# Patient Record
Sex: Male | Born: 1975 | Race: White | Hispanic: No | Marital: Married | Smoking: Never smoker
Health system: Southern US, Community
[De-identification: ages and names within clinical notes are randomized; demographics above are authoritative.]

## PROBLEM LIST (undated history)

## (undated) DIAGNOSIS — K5010 Crohn's disease of large intestine without complications: Secondary | ICD-10-CM

## (undated) DIAGNOSIS — I10 Essential (primary) hypertension: Secondary | ICD-10-CM

## (undated) DIAGNOSIS — K625 Hemorrhage of anus and rectum: Secondary | ICD-10-CM

## (undated) HISTORY — DX: Essential (primary) hypertension: I10

## (undated) HISTORY — DX: Crohn's disease of large intestine without complications: K50.10

---

## 2016-12-14 ENCOUNTER — Encounter (INDEPENDENT_AMBULATORY_CARE_PROVIDER_SITE_OTHER): Payer: Self-pay | Admitting: Internal Medicine

## 2016-12-14 ENCOUNTER — Encounter (INDEPENDENT_AMBULATORY_CARE_PROVIDER_SITE_OTHER): Payer: Self-pay

## 2017-01-03 ENCOUNTER — Encounter (INDEPENDENT_AMBULATORY_CARE_PROVIDER_SITE_OTHER): Payer: Self-pay | Admitting: Internal Medicine

## 2017-01-03 ENCOUNTER — Ambulatory Visit (INDEPENDENT_AMBULATORY_CARE_PROVIDER_SITE_OTHER): Payer: Self-pay | Admitting: Internal Medicine

## 2017-01-03 VITALS — BP 170/70 | HR 120 | Temp 97.8°F | Ht 76.0 in | Wt 239.6 lb

## 2017-01-03 DIAGNOSIS — K5010 Crohn's disease of large intestine without complications: Secondary | ICD-10-CM

## 2017-01-03 DIAGNOSIS — I10 Essential (primary) hypertension: Secondary | ICD-10-CM

## 2017-01-03 DIAGNOSIS — K50011 Crohn's disease of small intestine with rectal bleeding: Secondary | ICD-10-CM

## 2017-01-03 HISTORY — DX: Crohn's disease of large intestine without complications: K50.10

## 2017-01-03 HISTORY — DX: Essential (primary) hypertension: I10

## 2017-01-03 MED ORDER — MESALAMINE ER 500 MG PO CPCR: 500.0000 mg | ORAL_CAPSULE | Freq: Three times a day (TID) | ORAL | 3 refills | Status: DC

## 2017-01-03 MED ORDER — AMLODIPINE BESYLATE 5 MG PO TABS: 5.0000 mg | ORAL_TABLET | Freq: Every day | ORAL | 0 refills | Status: DC

## 2017-01-03 NOTE — Progress Notes (Signed)
   Subjective:    Patient ID: Dwayne Cooper, male    DOB: 30-Aug-1975, 41 y.o.   MRN: [id number]  HPI Referred by Dr Anthony Sar for inflammatory bowel disease. Admitted to [facility] hospital n July with profuse diarrhea and rectal bleeding, and abdominal pain. He had been having symptoms for over a year (Rectal bleeding and diarrhea). Also has hemorrhoids.   Per Dr. Anthony Sar notes,  inflammatory changes were consistent with Crohn's colitis.  Underwent a colonoscopy 11/25/2016 for rectal bleeding.  Circumferential diffuse abnormal mucosa was found thru out the entire examined colon. The mucosa had deep ulcers, scarring and granularity.  Biopsy: Colon: Acute colitis. No dysplasia or malignancy identified. Rectum biopsy: chronic active colitis. No dysplasia or malignancy identified.  States when he stops the Prednisone, he has abdominal pain (lower). No rectal bleeding. Stools are formed.  Presently taking 49m Prednisone x 31 days.  States he feels great now as long as he is on the Prednisone.    11/23/2016 CT abdomen/pelvis with CM: abdominal pain with rectal bleeding: Changes consistent with colitis with mild likely reactive lymph nodes. No obstrucdtive changes are seen.    Review of Systems Past Medical History:  Diagnosis Date  . [place] 01/03/[place]  . Essential hypertension, benign 01/03/2017    No past surgical history on file.  No Known Allergies  No current outpatient prescriptions on file prior to visit.   No current facility-administered medications on file prior to visit.         Objective:   Physical Exam  Blood pressure (!) 170/70, pulse (!) 120, temperature 97.8 F (36.6 C), height 6' 4"  (1.93 m), weight 239 lb 9.6 oz (108.7 kg).  Alert and oriented. Skin warm and dry. Oral mucosa is moist.   . Sclera anicteric, conjunctivae is pink. Thyroid not enlarged. No cervical lymphadenopathy. Lungs clear. Heart regular rate and rhythm.  Abdomen is soft. Bowel sounds are positive.  No hepatomegaly. No abdominal masses felt. No tenderness.  No edema to lower extremities.        Assessment & Plan:  Crohn's: Taper Prednisone 3147mand decrease by 47m70meekly. Pentasa 500m18mD.  Hypertenson: Will refill Norvasc 47mg 10mly x 30 days. I will fill this and [facility]

## 2017-01-03 NOTE — Patient Instructions (Signed)
Rx for Pentasa 563m TID Rx for Prednisone 148m #120.

## 2017-01-04 ENCOUNTER — Telehealth (INDEPENDENT_AMBULATORY_CARE_PROVIDER_SITE_OTHER): Payer: Self-pay | Admitting: Internal Medicine

## 2017-01-04 LAB — CBC WITH DIFFERENTIAL/PLATELET
Basophils Absolute: 8 cells/uL (ref 0–200)
Basophils Relative: 0.1 %
EOS ABS: 0 {cells}/uL — AB (ref 15–500)
Eosinophils Relative: 0 %
HCT: 30.7 % — ABNORMAL LOW (ref 38.5–50.0)
Hemoglobin: 9.2 g/dL — ABNORMAL LOW (ref 13.2–17.1)
Lymphs Abs: 720 cells/uL — ABNORMAL LOW (ref 850–3900)
MCH: 21.3 pg — AB (ref 27.0–33.0)
MCHC: 30 g/dL — ABNORMAL LOW (ref 32.0–36.0)
MCV: 71.1 fL — AB (ref 80.0–100.0)
MONOS PCT: 6.1 %
MPV: 9.4 fL (ref 7.5–12.5)
NEUTROS PCT: 84.2 %
Neutro Abs: 6315 cells/uL (ref 1500–7800)
PLATELETS: 409 10*3/uL — AB (ref 140–400)
RBC: 4.32 10*6/uL (ref 4.20–5.80)
RDW: 16.2 % — AB (ref 11.0–15.0)
TOTAL LYMPHOCYTE: 9.6 %
WBC: 7.5 10*3/uL (ref 3.8–10.8)
WBCMIX: 458 {cells}/uL (ref 200–950)

## 2017-01-04 LAB — C-REACTIVE PROTEIN: CRP: 7.1 mg/L (ref <8.0)

## 2017-01-04 NOTE — Telephone Encounter (Signed)
Patient called, stated that he saw Deberah Castle, NP yesterday, he would like to speak to Terri regarding medication she prescribed for him yesterday.  The medication cost is outrageous.  He wants to see what else she suggest.  [phone]

## 2017-01-05 ENCOUNTER — Telehealth (INDEPENDENT_AMBULATORY_CARE_PROVIDER_SITE_OTHER): Payer: Self-pay | Admitting: Internal Medicine

## 2017-01-05 MED ORDER — MESALAMINE ER 0.375 G PO CP24: 375.0000 mg | ORAL_CAPSULE | Freq: Every day | ORAL | 3 refills | Status: DC

## 2017-01-05 NOTE — Telephone Encounter (Signed)
Rx for Apriso sent to his pharmacy

## 2017-01-05 NOTE — Telephone Encounter (Signed)
Samples of Apriso to front. Rx sent to his pharmacy

## 2017-01-08 ENCOUNTER — Other Ambulatory Visit (INDEPENDENT_AMBULATORY_CARE_PROVIDER_SITE_OTHER): Payer: Self-pay | Admitting: *Deleted

## 2017-01-08 DIAGNOSIS — K50011 Crohn's disease of small intestine with rectal bleeding: Secondary | ICD-10-CM

## 2017-01-25 ENCOUNTER — Other Ambulatory Visit (INDEPENDENT_AMBULATORY_CARE_PROVIDER_SITE_OTHER): Payer: Self-pay | Admitting: *Deleted

## 2017-01-25 ENCOUNTER — Encounter (INDEPENDENT_AMBULATORY_CARE_PROVIDER_SITE_OTHER): Payer: Self-pay | Admitting: *Deleted

## 2017-01-25 DIAGNOSIS — K50011 Crohn's disease of small intestine with rectal bleeding: Secondary | ICD-10-CM

## 2017-02-01 ENCOUNTER — Other Ambulatory Visit (INDEPENDENT_AMBULATORY_CARE_PROVIDER_SITE_OTHER): Payer: Self-pay | Admitting: Internal Medicine

## 2017-02-01 DIAGNOSIS — I10 Essential (primary) hypertension: Secondary | ICD-10-CM

## 2017-02-19 LAB — CBC
HCT: 33.7 % — ABNORMAL LOW (ref 38.5–50.0)
Hemoglobin: 10.4 g/dL — ABNORMAL LOW (ref 13.2–17.1)
MCH: 21.4 pg — AB (ref 27.0–33.0)
MCHC: 30.9 g/dL — ABNORMAL LOW (ref 32.0–36.0)
MCV: 69.3 fL — ABNORMAL LOW (ref 80.0–100.0)
MPV: 10.8 fL (ref 7.5–12.5)
Platelets: 403 10*3/uL — ABNORMAL HIGH (ref 140–400)
RBC: 4.86 10*6/uL (ref 4.20–5.80)
RDW: 16.8 % — ABNORMAL HIGH (ref 11.0–15.0)
WBC: 8.9 10*3/uL (ref 3.8–10.8)

## 2017-02-21 ENCOUNTER — Other Ambulatory Visit (INDEPENDENT_AMBULATORY_CARE_PROVIDER_SITE_OTHER): Payer: Self-pay | Admitting: *Deleted

## 2017-02-21 DIAGNOSIS — K5010 Crohn's disease of large intestine without complications: Secondary | ICD-10-CM

## 2017-02-22 ENCOUNTER — Other Ambulatory Visit (INDEPENDENT_AMBULATORY_CARE_PROVIDER_SITE_OTHER): Payer: Self-pay | Admitting: *Deleted

## 2017-02-22 ENCOUNTER — Encounter (INDEPENDENT_AMBULATORY_CARE_PROVIDER_SITE_OTHER): Payer: Self-pay | Admitting: *Deleted

## 2017-02-22 DIAGNOSIS — K5010 Crohn's disease of large intestine without complications: Secondary | ICD-10-CM

## 2017-02-24 ENCOUNTER — Other Ambulatory Visit (INDEPENDENT_AMBULATORY_CARE_PROVIDER_SITE_OTHER): Payer: Self-pay | Admitting: Internal Medicine

## 2017-02-24 DIAGNOSIS — I10 Essential (primary) hypertension: Secondary | ICD-10-CM

## 2017-02-28 ENCOUNTER — Ambulatory Visit (INDEPENDENT_AMBULATORY_CARE_PROVIDER_SITE_OTHER): Payer: Self-pay | Admitting: Internal Medicine

## 2017-03-07 ENCOUNTER — Encounter (INDEPENDENT_AMBULATORY_CARE_PROVIDER_SITE_OTHER): Payer: Self-pay | Admitting: Internal Medicine

## 2017-03-07 ENCOUNTER — Telehealth (INDEPENDENT_AMBULATORY_CARE_PROVIDER_SITE_OTHER): Payer: Self-pay | Admitting: Internal Medicine

## 2017-03-07 ENCOUNTER — Encounter (INDEPENDENT_AMBULATORY_CARE_PROVIDER_SITE_OTHER): Payer: Self-pay

## 2017-03-07 ENCOUNTER — Ambulatory Visit (INDEPENDENT_AMBULATORY_CARE_PROVIDER_SITE_OTHER): Payer: Self-pay | Admitting: Internal Medicine

## 2017-03-07 VITALS — BP 182/80 | HR 80 | Temp 97.9°F | Ht 76.0 in | Wt 260.7 lb

## 2017-03-07 DIAGNOSIS — K50111 Crohn's disease of large intestine with rectal bleeding: Secondary | ICD-10-CM

## 2017-03-07 NOTE — Patient Instructions (Signed)
Continue the Apriso. Labs today.

## 2017-03-07 NOTE — Progress Notes (Signed)
   Subjective:    Patient ID: Dwayne Cooper, male    DOB: [birth year], 41 y.o.   MRN: [id number]  HPI Here today for f/u. Last seen in September as a new patient.   . Admitted to [facility] hospital n July with profuse diarrhea and rectal bleeding, and abdominal pain. He had been having symptoms for over a year (Rectal bleeding and diarrhea). Also had hemorrhoids.   Per Dr. Anthony Sar notes,  inflammatory changes were consistent with Crohn's colitis.  Underwent a colonoscopy 11/25/2016 for rectal bleeding.  Circumferential diffuse abnormal mucosa was found thru out the entire examined colon. The mucosa had deep ulcers, scarring and granularity.  Biopsy: Colon: Acute colitis. No dysplasia or malignancy identified. Rectum biopsy: chronic active colitis. No dysplasia or malignancy identified.  At [facility] in September he was started on Apriso x 4 a day.  He has finished the Prednisone.  He feels much better. His appetite is good.  He is having a BM daily. No melena or BRRB.  He denies any GI symptoms.  He says if he misses a dose of the Apriso, he can tell.   CRP 01/03/2017 7.1.  CBC    Component Value Date/Time   WBC 8.9 02/19/2017 1624   RBC 4.86 02/19/2017 1624   HGB 10.4 (L) 02/19/2017 1624   HCT 33.7 (L) 02/19/2017 1624   PLT 403 (H) 02/19/2017 1624   MCV 69.3 (L) 02/19/2017 1624   MCH 21.4 (L) 02/19/2017 1624   MCHC 30.9 (L) 02/19/2017 1624   RDW 16.8 (H) 02/19/2017 1624   LYMPHSABS 720 (L) 01/03/2017 1545   EOSABS 0 (L) 01/03/2017 1545   BASOSABS 8 01/03/2017 1545      Review of Systems Past Medical History:  Diagnosis Date  . Crohn's colitis ([place]) 01/03/2017  . Essential hypertension, benign 01/03/2017    No past surgical history on file.  No Known Allergies  Current Outpatient Medications on File Prior to Visit  Medication Sig Dispense Refill  . amLODipine (NORVASC) 5 MG tablet Take 1 tablet (5 mg total) by mouth daily. (Patient taking differently: Take 10 mg daily by mouth. ) 30  tablet 0  . mesalamine (PENTASA) 500 MG CR capsule Take 1 capsule (500 mg total) by mouth 3 (three) times daily. 90 capsule 3  . mesalamine (APRISO) 0.375 g 24 hr capsule Take 1 capsule (0.375 g total) by mouth daily. 4 tabs daily. (Patient not taking: Reported on 03/07/2017) 120 capsule 3  . predniSONE (DELTASONE) 10 MG tablet Take 40 mg by mouth daily with breakfast.     No current facility-administered medications on file prior to visit.         Objective:   Physical Exam Blood pressure (!) 182/80, pulse 80, temperature 97.9 F (36.6 C), height 6' 4"  (1.93 m), weight 260 lb 11.2 oz (118.3 kg).  Alert and oriented. Skin warm and dry. Oral mucosa is moist.   . Sclera anicteric, conjunctivae is pink. Thyroid not enlarged. No cervical lymphadenopathy. Lungs clear. Heart regular rate and rhythm.  Abdomen is soft. Bowel sounds are positive. No hepatomegaly. No abdominal masses felt. No tenderness.  1+ edema to lower extremities.         Assessment & Plan:  Crohns. Seems to be doing well. Maintained on Apriso. 4 tabs a day. Labs in 4 weeks.(CBC and sedrate)  OV in 3 months

## 2017-03-07 NOTE — Telephone Encounter (Signed)
Dwayne Cooper add a Sedrate to his CBC

## 2017-03-08 ENCOUNTER — Other Ambulatory Visit (INDEPENDENT_AMBULATORY_CARE_PROVIDER_SITE_OTHER): Payer: Self-pay | Admitting: *Deleted

## 2017-03-08 DIAGNOSIS — K5010 Crohn's disease of large intestine without complications: Secondary | ICD-10-CM

## 2017-03-08 NOTE — Telephone Encounter (Signed)
The Carnella [place] has been ordered.

## 2017-05-31 ENCOUNTER — Encounter (INDEPENDENT_AMBULATORY_CARE_PROVIDER_SITE_OTHER): Payer: Self-pay | Admitting: Internal Medicine

## 2017-05-31 ENCOUNTER — Ambulatory Visit (INDEPENDENT_AMBULATORY_CARE_PROVIDER_SITE_OTHER): Payer: Self-pay | Admitting: Internal Medicine

## 2017-05-31 VITALS — BP 154/68 | HR 84 | Temp 97.7°F | Ht 76.0 in | Wt 252.9 lb

## 2017-05-31 DIAGNOSIS — K5010 Crohn's disease of large intestine without complications: Secondary | ICD-10-CM

## 2017-05-31 NOTE — Progress Notes (Addendum)
   Subjective:    Patient ID: Dwayne Cooper, male    DOB: 1975-08-08, 42 y.o.   MRN: [id number]  HPI Here today after recent visit to [facility] for rectal bleeding. Hx of Crohn's disease Underwent a colonoscopy 11/25/2016 for rectal bleeding.  Had been having rectal bleeding for over a year.  He says he was seen in the ED 05/27/2017 for rectal bleeding (very small amt).  He also has some diarrhea.  He was treated and released. He says he did have a syncopal episode at home. He was told he was dehydrated and was given 2 bags if IV fluids.  He says he had a very small amt of rectal bleeding. There was no fever. He states he feels much better since starting the Apriso. Stools are formed now.  Stools are back to normal.    Per Dr. Anthony Sar notes,  inflammatory changes were consistent with Crohn's colitis.  Underwent a colonoscopy 11/25/2016 for rectal bleeding.  Circumferential diffuse abnormal mucosa was found thru out the entire examined colon. The mucosa had deep ulcers, scarring and granularity.  Biopsy: Colon: Acute colitis. No dysplasia or malignancy identified. Rectum biopsy: chronic active colitis. No dysplasia or malignancy identified.    05/27/2017 H and H 11.9 and 38.1, WBC 12.5.  11/23/2016 CT abdomen/pelvis with CM: abdominal pain with rectal bleeding: Changes consistent with colitis with mild likely reactive lymph nodes. No obstrucdtive changes are seen.   Review of Systems Past Medical History:  Diagnosis Date  . Crohn's colitis ([place]) 01/03/2017  . Essential hypertension, benign 01/03/2017    History reviewed. No pertinent surgical history.  No Known Allergies  Current Outpatient Medications on File Prior to Visit  Medication Sig Dispense Refill  . amLODipine (NORVASC) 5 MG tablet Take 1 tablet (5 mg total) by mouth daily. (Patient taking differently: Take 10 mg daily by mouth. ) 30 tablet 0  . mesalamine (APRISO) 0.375 g 24 hr capsule Take 1 capsule (0.375 g total) by mouth daily. 4  tabs daily. 120 capsule 3   No current facility-administered medications on file prior to visit.         Objective:   Physical Exam Blood pressure (!) 154/68, pulse 84, temperature 97.7 F (36.5 C), height 6' 4"  (1.93 m), weight 252 lb 14.4 oz (114.7 kg). Alert and oriented. Skin warm and dry. Oral mucosa is moist.   . Sclera anicteric, conjunctivae is pink. Thyroid not enlarged. No cervical lymphadenopathy. Lungs clear. Heart regular rate and rhythm.  Abdomen is soft. Bowel sounds are positive. No hepatomegaly. No abdominal masses felt. No tenderness.  No edema to lower extremities.           Assessment & Plan:  Crohn's ? flare. Am going to get a CBC, CRP, Hepatitis B surface antigen.  Rx for PPD given to patient.

## 2017-05-31 NOTE — Patient Instructions (Signed)
CBC and CRP. PPD Rx given to patient.

## 2017-06-01 LAB — CBC WITH DIFFERENTIAL/PLATELET
BASOS PCT: 0.3 %
Basophils Absolute: 35 cells/uL (ref 0–200)
EOS ABS: 35 {cells}/uL (ref 15–500)
Eosinophils Relative: 0.3 %
HCT: 34.3 % — ABNORMAL LOW (ref 38.5–50.0)
HEMOGLOBIN: 10.8 g/dL — AB (ref 13.2–17.1)
LYMPHS ABS: 835 {cells}/uL — AB (ref 850–3900)
MCH: 22.6 pg — AB (ref 27.0–33.0)
MCHC: 31.5 g/dL — ABNORMAL LOW (ref 32.0–36.0)
MCV: 71.8 fL — ABNORMAL LOW (ref 80.0–100.0)
MONOS PCT: 6.7 %
MPV: 9.6 fL (ref 7.5–12.5)
Neutro Abs: 9918 cells/uL — ABNORMAL HIGH (ref 1500–7800)
Neutrophils Relative %: 85.5 %
PLATELETS: 496 10*3/uL — AB (ref 140–400)
RBC: 4.78 10*6/uL (ref 4.20–5.80)
RDW: 16.9 % — ABNORMAL HIGH (ref 11.0–15.0)
Total Lymphocyte: 7.2 %
WBC: 11.6 10*3/uL — ABNORMAL HIGH (ref 3.8–10.8)
WBCMIX: 777 {cells}/uL (ref 200–950)

## 2017-06-01 LAB — HEPATITIS B SURFACE ANTIGEN: HEP B S AG: NONREACTIVE

## 2017-06-01 LAB — C-REACTIVE PROTEIN: CRP: 96.1 mg/L — ABNORMAL HIGH (ref <8.0)

## 2017-06-04 ENCOUNTER — Telehealth (INDEPENDENT_AMBULATORY_CARE_PROVIDER_SITE_OTHER): Payer: Self-pay | Admitting: Internal Medicine

## 2017-06-04 DIAGNOSIS — K50119 Crohn's disease of large intestine with unspecified complications: Secondary | ICD-10-CM

## 2017-06-04 NOTE — Telephone Encounter (Signed)
Sed rate ordered.

## 2017-06-06 ENCOUNTER — Ambulatory Visit (INDEPENDENT_AMBULATORY_CARE_PROVIDER_SITE_OTHER): Payer: Self-pay | Admitting: Internal Medicine

## 2017-06-06 ENCOUNTER — Telehealth (INDEPENDENT_AMBULATORY_CARE_PROVIDER_SITE_OTHER): Payer: Self-pay | Admitting: *Deleted

## 2017-06-06 ENCOUNTER — Telehealth (INDEPENDENT_AMBULATORY_CARE_PROVIDER_SITE_OTHER): Payer: Self-pay | Admitting: Internal Medicine

## 2017-06-06 DIAGNOSIS — K50118 Crohn's disease of large intestine with other complication: Secondary | ICD-10-CM

## 2017-06-06 LAB — CBC
HEMATOCRIT: 33.4 % — AB (ref 38.5–50.0)
HEMOGLOBIN: 10.5 g/dL — AB (ref 13.2–17.1)
MCH: 22.4 pg — AB (ref 27.0–33.0)
MCHC: 31.4 g/dL — AB (ref 32.0–36.0)
MCV: 71.2 fL — AB (ref 80.0–100.0)
MPV: 9 fL (ref 7.5–12.5)
Platelets: 708 10*3/uL — ABNORMAL HIGH (ref 140–400)
RBC: 4.69 10*6/uL (ref 4.20–5.80)
RDW: 16.3 % — ABNORMAL HIGH (ref 11.0–15.0)
WBC: 9.9 10*3/uL (ref 3.8–10.8)

## 2017-06-06 LAB — SEDIMENTATION RATE: Sed Rate: 63 mm/h — ABNORMAL HIGH (ref 0–15)

## 2017-06-06 MED ORDER — PREDNISONE 5 MG PO TABS: 5.0000 mg | ORAL_TABLET | Freq: Every day | ORAL | 0 refills | Status: DC

## 2017-06-06 NOTE — Telephone Encounter (Signed)
Patient called stating [facility] has not received his prednisone steroids

## 2017-06-06 NOTE — Telephone Encounter (Signed)
CBC and sedrate ordered.

## 2017-06-06 NOTE — Telephone Encounter (Signed)
err

## 2017-06-06 NOTE — Telephone Encounter (Signed)
Rx sent to his pharmacy

## 2017-06-07 ENCOUNTER — Ambulatory Visit (INDEPENDENT_AMBULATORY_CARE_PROVIDER_SITE_OTHER): Payer: Self-pay | Admitting: Internal Medicine

## 2017-06-11 ENCOUNTER — Telehealth (INDEPENDENT_AMBULATORY_CARE_PROVIDER_SITE_OTHER): Payer: Self-pay | Admitting: Internal Medicine

## 2017-06-11 DIAGNOSIS — K50118 Crohn's disease of large intestine with other complication: Secondary | ICD-10-CM

## 2017-06-11 MED ORDER — METRONIDAZOLE 500 MG PO TABS: 500.0000 mg | ORAL_TABLET | Freq: Two times a day (BID) | ORAL | 0 refills | Status: DC

## 2017-06-11 MED ORDER — CIPROFLOXACIN HCL 500 MG PO TABS: 500.0000 mg | ORAL_TABLET | Freq: Two times a day (BID) | ORAL | 0 refills | Status: DC

## 2017-06-11 NOTE — Telephone Encounter (Signed)
Rx for Cipro and Flagyl sent to his pharmacy.

## 2017-06-18 ENCOUNTER — Encounter (HOSPITAL_COMMUNITY): Payer: Self-pay | Admitting: *Deleted

## 2017-06-18 ENCOUNTER — Emergency Department (HOSPITAL_COMMUNITY): Payer: Self-pay

## 2017-06-18 ENCOUNTER — Inpatient Hospital Stay (HOSPITAL_COMMUNITY)
Admission: EM | Admit: 2017-06-18 | Discharge: 2017-06-23 | DRG: 385 | Disposition: A | Discharge status: Home / Self Care | Payer: Self-pay | Attending: Internal Medicine | Admitting: Internal Medicine

## 2017-06-18 ENCOUNTER — Telehealth (INDEPENDENT_AMBULATORY_CARE_PROVIDER_SITE_OTHER): Payer: Self-pay | Admitting: *Deleted

## 2017-06-18 ENCOUNTER — Telehealth (INDEPENDENT_AMBULATORY_CARE_PROVIDER_SITE_OTHER): Payer: Self-pay | Admitting: Internal Medicine

## 2017-06-18 DIAGNOSIS — K50119 Crohn's disease of large intestine with unspecified complications: Secondary | ICD-10-CM | POA: Diagnosis present

## 2017-06-18 DIAGNOSIS — K50111 Crohn's disease of large intestine with rectal bleeding: Principal | ICD-10-CM | POA: Diagnosis present

## 2017-06-18 DIAGNOSIS — K5090 Crohn's disease, unspecified, without complications: Secondary | ICD-10-CM | POA: Diagnosis present

## 2017-06-18 DIAGNOSIS — Z79899 Other long term (current) drug therapy: Secondary | ICD-10-CM

## 2017-06-18 DIAGNOSIS — Z23 Encounter for immunization: Secondary | ICD-10-CM

## 2017-06-18 DIAGNOSIS — E43 Unspecified severe protein-calorie malnutrition: Secondary | ICD-10-CM | POA: Diagnosis present

## 2017-06-18 DIAGNOSIS — K50919 Crohn's disease, unspecified, with unspecified complications: Secondary | ICD-10-CM

## 2017-06-18 DIAGNOSIS — K5010 Crohn's disease of large intestine without complications: Secondary | ICD-10-CM | POA: Diagnosis present

## 2017-06-18 DIAGNOSIS — I10 Essential (primary) hypertension: Secondary | ICD-10-CM | POA: Diagnosis present

## 2017-06-18 DIAGNOSIS — Z6828 Body mass index (BMI) 28.0-28.9, adult: Secondary | ICD-10-CM

## 2017-06-18 DIAGNOSIS — K50011 Crohn's disease of small intestine with rectal bleeding: Secondary | ICD-10-CM

## 2017-06-18 DIAGNOSIS — E860 Dehydration: Secondary | ICD-10-CM | POA: Diagnosis present

## 2017-06-18 DIAGNOSIS — D500 Iron deficiency anemia secondary to blood loss (chronic): Secondary | ICD-10-CM | POA: Diagnosis present

## 2017-06-18 HISTORY — DX: Hemorrhage of anus and rectum: K62.5

## 2017-06-18 LAB — BASIC METABOLIC PANEL
ANION GAP: 14 (ref 5–15)
BUN: 18 mg/dL (ref 6–20)
CALCIUM: 8.8 mg/dL — AB (ref 8.9–10.3)
CO2: 25 mmol/L (ref 22–32)
Chloride: 100 mmol/L — ABNORMAL LOW (ref 101–111)
Creatinine, Ser: 1.09 mg/dL (ref 0.61–1.24)
GLUCOSE: 137 mg/dL — AB (ref 65–99)
Potassium: 4.3 mmol/L (ref 3.5–5.1)
Sodium: 139 mmol/L (ref 135–145)

## 2017-06-18 LAB — URINALYSIS, ROUTINE W REFLEX MICROSCOPIC
Bilirubin Urine: NEGATIVE
GLUCOSE, UA: NEGATIVE mg/dL
Hgb urine dipstick: NEGATIVE
KETONES UR: NEGATIVE mg/dL
LEUKOCYTES UA: NEGATIVE
Nitrite: NEGATIVE
PH: 6 (ref 5.0–8.0)
Protein, ur: NEGATIVE mg/dL
Specific Gravity, Urine: >1.046 — ABNORMAL HIGH (ref 1.005–1.030)

## 2017-06-18 LAB — TYPE AND SCREEN
ABO/RH(D): O POS
ANTIBODY SCREEN: NEGATIVE

## 2017-06-18 LAB — POC OCCULT BLOOD, ED: Fecal Occult Bld: POSITIVE — AB

## 2017-06-18 LAB — CBC WITH DIFFERENTIAL/PLATELET
BASOS ABS: 0 10*3/uL (ref 0.0–0.1)
BASOS PCT: 0 %
EOS ABS: 0 10*3/uL (ref 0.0–0.7)
Eosinophils Relative: 0 %
HCT: 40.1 % (ref 39.0–52.0)
Hemoglobin: 11.9 g/dL — ABNORMAL LOW (ref 13.0–17.0)
Lymphocytes Relative: 6 %
Lymphs Abs: 0.9 10*3/uL (ref 0.7–4.0)
MCH: 22.2 pg — ABNORMAL LOW (ref 26.0–34.0)
MCHC: 29.7 g/dL — ABNORMAL LOW (ref 30.0–36.0)
MCV: 74.8 fL — ABNORMAL LOW (ref 78.0–100.0)
MONO ABS: 0.6 10*3/uL (ref 0.1–1.0)
Monocytes Relative: 4 %
Neutro Abs: 12.8 10*3/uL (ref 1.7–7.7)
Neutrophils Relative %: 90 %
PLATELETS: 639 10*3/uL — AB (ref 150–400)
RBC: 5.36 MIL/uL (ref 4.22–5.81)
RDW: 16.6 % — AB (ref 11.5–15.5)
WBC: 14.3 10*3/uL — ABNORMAL HIGH (ref 4.0–10.5)

## 2017-06-18 MED ORDER — SODIUM CHLORIDE 0.9 % IV BOLUS (SEPSIS)
1000.0000 mL | Freq: Once | INTRAVENOUS | Status: AC
  Administered 2017-06-18: 1000 mL via INTRAVENOUS

## 2017-06-18 MED ORDER — ENOXAPARIN SODIUM 40 MG/0.4ML ~~LOC~~ SOLN: 40.0000 mg | SUBCUTANEOUS | Status: DC

## 2017-06-18 MED ORDER — DEXAMETHASONE SODIUM PHOSPHATE 4 MG/ML IJ SOLN
8.0000 mg | Freq: Once | INTRAMUSCULAR | Status: AC
  Administered 2017-06-18: 8 mg via INTRAVENOUS
  Filled 2017-06-18: qty 2

## 2017-06-18 MED ORDER — MESALAMINE ER 0.375 G PO CP24
0.3750 g | ORAL_CAPSULE | Freq: Every day | ORAL | Status: DC
  Administered 2017-06-20 – 2017-06-23 (×4): 0.375 g via ORAL
  Filled 2017-06-18: qty 1

## 2017-06-18 MED ORDER — OXYCODONE HCL 5 MG PO TABS
5.0000 mg | ORAL_TABLET | ORAL | Status: DC | PRN
  Administered 2017-06-19 – 2017-06-23 (×4): 5 mg via ORAL
  Filled 2017-06-18 (×4): qty 1

## 2017-06-18 MED ORDER — ACETAMINOPHEN 650 MG RE SUPP: 650.0000 mg | Freq: Four times a day (QID) | RECTAL | Status: DC | PRN

## 2017-06-18 MED ORDER — ONDANSETRON HCL 4 MG PO TABS: 4.0000 mg | ORAL_TABLET | Freq: Four times a day (QID) | ORAL | Status: DC | PRN

## 2017-06-18 MED ORDER — ACETAMINOPHEN 325 MG PO TABS: 650.0000 mg | ORAL_TABLET | Freq: Four times a day (QID) | ORAL | Status: DC | PRN

## 2017-06-18 MED ORDER — MORPHINE SULFATE (PF) 4 MG/ML IV SOLN
4.0000 mg | Freq: Once | INTRAVENOUS | Status: AC
  Administered 2017-06-18: 4 mg via INTRAVENOUS
  Filled 2017-06-18: qty 1

## 2017-06-18 MED ORDER — IOPAMIDOL (ISOVUE-300) INJECTION 61%
100.0000 mL | Freq: Once | INTRAVENOUS | Status: AC | PRN
  Administered 2017-06-18: 100 mL via INTRAVENOUS

## 2017-06-18 MED ORDER — ONDANSETRON HCL 4 MG/2ML IJ SOLN
4.0000 mg | Freq: Once | INTRAMUSCULAR | Status: AC
  Administered 2017-06-18: 4 mg via INTRAVENOUS
  Filled 2017-06-18: qty 2

## 2017-06-18 MED ORDER — METHYLPREDNISOLONE SODIUM SUCC 40 MG IJ SOLR
40.0000 mg | Freq: Four times a day (QID) | INTRAMUSCULAR | Status: DC
  Administered 2017-06-18 – 2017-06-19 (×4): 40 mg via INTRAVENOUS
  Filled 2017-06-18 (×4): qty 1

## 2017-06-18 MED ORDER — CIPROFLOXACIN IN D5W 400 MG/200ML IV SOLN
400.0000 mg | Freq: Two times a day (BID) | INTRAVENOUS | Status: DC
  Administered 2017-06-18 – 2017-06-20 (×4): 400 mg via INTRAVENOUS
  Filled 2017-06-18 (×4): qty 200

## 2017-06-18 MED ORDER — SODIUM CHLORIDE 0.9 % IV SOLN
INTRAVENOUS | Status: DC
  Administered 2017-06-18 – 2017-06-22 (×6): via INTRAVENOUS

## 2017-06-18 MED ORDER — MORPHINE SULFATE (PF) 2 MG/ML IV SOLN: 2.0000 mg | INTRAVENOUS | Status: DC | PRN

## 2017-06-18 MED ORDER — ONDANSETRON HCL 4 MG/2ML IJ SOLN: 4.0000 mg | Freq: Four times a day (QID) | INTRAMUSCULAR | Status: DC | PRN

## 2017-06-18 MED ORDER — METRONIDAZOLE IN NACL 5-0.79 MG/ML-% IV SOLN
500.0000 mg | Freq: Three times a day (TID) | INTRAVENOUS | Status: DC
  Administered 2017-06-19 – 2017-06-20 (×6): 500 mg via INTRAVENOUS
  Filled 2017-06-18 (×6): qty 100

## 2017-06-18 NOTE — Telephone Encounter (Signed)
Dwayne Cooper patient's girlfriend called left message to talk to Carloyn Manner states the patient needs to be seen by someone he needs more than just blood work. Dwayne Cooper stated patient is having diarrhea and bloody stool, weight loss, and throwing up. Per Dwayne Cooper patient is very weak. Eustaquio Maize states she is bringing patient today for the blood work but she would like to talk to [place]. [phone]

## 2017-06-18 NOTE — H&P (Signed)
History and Physical    Dwayne Cooper IWP:[id number] DOB: 1975/12/01 DOA: 06/18/2017    PCP: [facility] [place]  Patient coming from: home  Chief Complaint:  Diarrhea and abdominal pain  HPI: Dwayne Cooper is a 42 y.o. male with medical history of Crohn's disease presents with bloody diarrhea and abdominal pain. He has been having this for about 3 wks now and has been on a Prednisone taper (currently at 40 mg), Cipro and Flagyl but symptoms have not resolved. He is having 8-10 stools per day. Pain is present across his lower abdomen. He has been vomiting and not able to tolerate solid food. He last vomited this afternoon.   ED Course: CT scan abd/pelvis "active inflammatory change noted of the distal and terminal ileum with moderate transmural thickening noted."  Review of Systems:  All other systems reviewed and apart from HPI, are negative.  Past Medical History:  Diagnosis Date  . Crohn's colitis ([place]) 01/03/2017  . Essential hypertension, benign 01/03/2017  . Rectal bleeding    chronic    History reviewed. No pertinent surgical history.  Social History:   reports that  has never smoked. he has never used smokeless tobacco. He reports that he does not drink alcohol or use drugs.  No Known Allergies  History reviewed. No pertinent family history.   Prior to Admission medications   Medication Sig Start Date End Date Taking? Authorizing Provider  amLODipine (NORVASC) 5 MG tablet Take 1 tablet (5 mg total) by mouth daily. Patient taking differently: Take 10 mg daily by mouth.  01/03/17  Yes Setzer, Terri L, NP  ciprofloxacin (CIPRO) 500 MG tablet Take 1 tablet (500 mg total) by mouth 2 (two) times daily. 06/11/17  Yes Setzer, Rona Ravens, NP  mesalamine (APRISO) 0.375 g 24 hr capsule Take 1 capsule (0.375 g total) by mouth daily. 4 tabs daily. 01/05/17  Yes Setzer, Terri L, NP  metroNIDAZOLE (FLAGYL) 500 MG tablet Take 1 tablet (500 mg total) by mouth 2 (two) times daily.  06/11/17  Yes Setzer, Rona Ravens, NP  Multiple Vitamin (MULTIVITAMIN WITH MINERALS) TABS tablet Take by mouth daily. 2 gummies   Yes [provider]  predniSONE (DELTASONE) 5 MG tablet Take 1 tablet (5 mg total) by mouth daily with breakfast. Take 71m  Daily x 1 week and then reduce by 525meach week. 06/06/17  Yes Setzer, TeRona RavensNP    Physical Exam: Wt Readings from Last 3 Encounters:  06/18/17 108.9 kg (240 lb)  05/31/17 114.7 kg (252 lb 14.4 oz)  03/07/17 118.3 kg (260 lb 11.2 oz)   Vitals:   06/18/17 1700 06/18/17 1730 06/18/17 1800 06/18/17 1830  BP: (!) 126/92 (!) 143/91 (!) 136/91 (!) 137/93  Pulse: (!) 108 (!) 104 (!) 106 (!) 106  Resp:      Temp:      TempSrc:      SpO2: 96% 96% 98% 96%  Weight:      Height:          Constitutional: NAD, calm, comfortable Eyes: PERTLA, lids and conjunctivae normal ENMT: Mucous membranes are moist. Posterior pharynx clear of any exudate or lesions. Normal dentition.  Neck: normal, supple, no masses, no thyromegaly Respiratory: clear to auscultation bilaterally, no wheezing, no crackles. Normal respiratory effort. No accessory muscle use.  Cardiovascular: S1 & S2 heard, regular rate and rhythm, no murmurs / rubs / gallops. No extremity edema. 2+ pedal pulses. No carotid bruits.  Abdomen: No distension,   Tenderness  across lower abdomen, no masses palpated. No hepatosplenomegaly. Bowel sounds normal.  Musculoskeletal: no clubbing / cyanosis. No joint deformity upper and lower extremities. Good ROM, no contractures. Normal muscle tone.  Skin: no rashes, lesions, ulcers. No induration Neurologic: CN 2-12 grossly intact. Sensation intact, DTR normal. Strength 5/5 in all 4 limbs.  Psychiatric: Normal judgment and insight. Alert and oriented x 3. Normal mood.     Labs on Admission: I have personally reviewed following labs and imaging studies  CBC: Recent Labs  Lab 06/18/17 1423  WBC 14.3*  NEUTROABS 12.8  HGB 11.9*  HCT 40.1    MCV 74.8*  PLT 182*   Basic Metabolic Panel: Recent Labs  Lab 06/18/17 1423  NA 139  K 4.3  CL 100*  CO2 25  GLUCOSE 137*  BUN 18  CREATININE 1.09  CALCIUM 8.8*   GFR: Estimated Creatinine Clearance: 118.2 mL/min (by C-G formula based on SCr of 1.09 mg/dL). Liver Function Tests: No results for input(s): AST, ALT, ALKPHOS, BILITOT, PROT, ALBUMIN in the last 168 hours. No results for input(s): LIPASE, AMYLASE in the last 168 hours. No results for input(s): AMMONIA in the last 168 hours. Coagulation Profile: No results for input(s): INR, PROTIME in the last 168 hours. Cardiac Enzymes: No results for input(s): CKTOTAL, CKMB, CKMBINDEX, TROPONINI in the last 168 hours. BNP (last 3 results) No results for input(s): PROBNP in the last 8760 hours. HbA1C: No results for input(s): HGBA1C in the last 72 hours. CBG: No results for input(s): GLUCAP in the last 168 hours. Lipid Profile: No results for input(s): CHOL, HDL, LDLCALC, TRIG, CHOLHDL, LDLDIRECT in the last 72 hours. Thyroid Function Tests: No results for input(s): TSH, T4TOTAL, FREET4, T3FREE, THYROIDAB in the last 72 hours. Anemia Panel: No results for input(s): VITAMINB12, FOLATE, FERRITIN, TIBC, IRON, RETICCTPCT in the last 72 hours. Urine analysis: No results found for: COLORURINE, APPEARANCEUR, LABSPEC, PHURINE, GLUCOSEU, HGBUR, BILIRUBINUR, KETONESUR, PROTEINUR, UROBILINOGEN, NITRITE, LEUKOCYTESUR Sepsis Labs: @LABRCNTIP (procalcitonin:4,lacticidven:4) )No results found for this or any previous visit (from the past 240 hour(s)).   Radiological Exams on Admission: Ct Abdomen Pelvis W Contrast  Result Date: 06/18/2017 CLINICAL DATA:  Crohn's disease with rectal bleeding x1 year. Patient passed out last week. EXAM: CT ABDOMEN AND PELVIS WITH CONTRAST TECHNIQUE: Multidetector CT imaging of the abdomen and pelvis was performed using the standard protocol following bolus administration of intravenous contrast. CONTRAST:   173m ISOVUE-300 IOPAMIDOL (ISOVUE-300) INJECTION 61% COMPARISON:  11/23/2016 FINDINGS: LOWER CHEST: Lung bases are clear. Included heart size is normal. Small hiatal hernia. No pericardial effusion. HEPATOBILIARY: Liver and gallbladder are normal. PANCREAS: Normal. SPLEEN: Normal. ADRENALS/URINARY TRACT: Kidneys are orthotopic, demonstrating symmetric enhancement. No nephrolithiasis, hydronephrosis or solid renal masses. Urinary bladder is partially distended and unremarkable. Normal adrenal glands. STOMACH/BOWEL: Nondistended stomach with normal small bowel rotation. Mild diffuse distal ileal thickening in the right lower quadrant compatible with changes of ileitis and underlying patient history of Crohn's disease. No pseudo sacculation 0 bowel obstructions. No fistulous connection. Moderate stool burden within nondistended colon. Submucosal fat deposition with mild-to-moderate luminal narrowing of the rectosigmoid compatible which changes of chronic inflammatory bowel disease. Normal appendix. VASCULAR/LYMPHATIC: No aortic aneurysm. Patent branch vessels. No lymphadenopathy. REPRODUCTIVE: Normal size prostate. OTHER: No intraperitoneal free fluid or free air. MUSCULOSKELETAL: Sacroiliac joints are unremarkable. Slight disc space narrowing L4-5 and L5-S1. No acute nor suspicious osseous lesions. IMPRESSION: 1. Mild luminal narrowing and submucosal fat deposition along the rectosigmoid compatible with changes of chronic inflammatory bowel disease. More  active inflammatory change noted of the distal and terminal ileum with moderate transmural thickening noted. 2. No bowel obstruction or free air. 3. No acute solid organ pathology. 4. Mild disc space narrowing L4-5 and L5-S1. Electronically Signed   By: Ashley Royalty M.D.   On: 06/18/2017 20:17       Assessment/Plan Active Problems:   Crohn disease with acute flare with bloody stools  Dehydration   Leukocytosis  - ileitis which has been resistant to outpt  treatment with Prednisone and antibiotics - will give IV steroids, Cipro and Flagyl - cont Mesalamine as well - place on clear liquids - receiving 2nd L of Normal saline- give continuous fluids - follow I and O - consult GI- he follows with Dr Laural Golden     Essential hypertension, benign - BP currently normal and therefore will hold Amlodipine  Anemia/ thrombocytosis - currently mild anemia but he may be due to hemoconcentration and Hb may drop after IVF - check Iron levels to ensure he has sufficient stores    DVT prophylaxis: SCDs  Code Status: Full code  Family Communication:   Disposition Plan: med/surg  Consults called: GI  Admission status: inpatient    Debbe Odea MD [facility] Pager: [email or web address] Password [email or web address] 7PM-7AM, please contact night-coverage   06/18/2017, 9:37 PM

## 2017-06-18 NOTE — Telephone Encounter (Signed)
Patient is in the ED.

## 2017-06-18 NOTE — Telephone Encounter (Signed)
error 

## 2017-06-18 NOTE — ED Triage Notes (Addendum)
Pt with gi bleed for a year, states since Friday it has been constant.  Pt sees T. Seltzer with GI.

## 2017-06-18 NOTE — Telephone Encounter (Signed)
I have spoken with patient.  Will discuss with Dr. Laural Golden today.

## 2017-06-18 NOTE — ED Notes (Signed)
Informed pt of urine specimen needed. Pt stated he cannot at this time. Will check back.

## 2017-06-18 NOTE — ED Provider Notes (Signed)
[facility] EMERGENCY DEPARTMENT Provider Note   CSN: [id number] Arrival date & time: 06/18/17  1338     History   Chief Complaint Chief Complaint  Patient presents with  . GI Bleeding    HPI Dwayne Cooper is a 42 y.o. male.  Lower abdominal pain, bloody stool for several days.  Patient has a known history of Crohn's disease.  He is presently taking a low-dose of prednisone.  No fever, sweats, chills severity is moderate to severe.  Nothing makes symptoms better or worse.      Past Medical History:  Diagnosis Date  . Crohn's colitis ([place]) 01/03/2017  . Essential hypertension, benign 01/03/2017  . Rectal bleeding    chronic    Patient Active Problem List   Diagnosis Date Noted  . Crohn disease ([place]) 06/18/2017  . Essential hypertension, benign 01/03/2017  . Crohn's colitis ([place]) 01/03/2017    History reviewed. No pertinent surgical history.     Home Medications    Prior to Admission medications   Medication Sig Start Date End Date Taking? Authorizing Provider  amLODipine (NORVASC) 5 MG tablet Take 1 tablet (5 mg total) by mouth daily. Patient taking differently: Take 10 mg daily by mouth.  01/03/17  Yes Setzer, Terri L, NP  ciprofloxacin (CIPRO) 500 MG tablet Take 1 tablet (500 mg total) by mouth 2 (two) times daily. 06/11/17  Yes Setzer, Rona Ravens, NP  mesalamine (APRISO) 0.375 g 24 hr capsule Take 1 capsule (0.375 g total) by mouth daily. 4 tabs daily. 01/05/17  Yes Setzer, Terri L, NP  metroNIDAZOLE (FLAGYL) 500 MG tablet Take 1 tablet (500 mg total) by mouth 2 (two) times daily. 06/11/17  Yes Setzer, Rona Ravens, NP  Multiple Vitamin (MULTIVITAMIN WITH MINERALS) TABS tablet Take by mouth daily. 2 gummies   Yes [provider]  predniSONE (DELTASONE) 5 MG tablet Take 1 tablet (5 mg total) by mouth daily with breakfast. Take 23m  Daily x 1 week and then reduce by 568meach week. 06/06/17  Yes Setzer, TeRona RavensNP    Family History History reviewed. No pertinent  family history.  Social History Social History   Tobacco Use  . Smoking status: Never Smoker  . Smokeless tobacco: Never Used  Substance Use Topics  . Alcohol use: No  . Drug use: No     Allergies   Patient has no known allergies.   Review of Systems Review of Systems  All other systems reviewed and are negative.    Physical Exam Updated Vital Signs BP 129/75   Pulse (!) 101   Temp (!) 97.5 F (36.4 C) (Oral)   Resp (!) 22   Ht 6' 4"  (1.93 m)   Wt 108.9 kg (240 lb)   SpO2 97%   BMI 29.21 kg/m   Physical Exam  Constitutional: He is oriented to person, place, and time.  Pale, uncomfortable  HENT:  Head: Normocephalic and atraumatic.  Eyes: Conjunctivae are normal.  Neck: Neck supple.  Cardiovascular: Normal rate and regular rhythm.  Pulmonary/Chest: Effort normal and breath sounds normal.  Abdominal: Soft. Bowel sounds are normal.  Genitourinary:  Genitourinary Comments: No masses, no blood on fingertip, Hemoccult positive  Musculoskeletal: Normal range of motion.  Neurological: He is alert and oriented to person, place, and time.  Skin: Skin is warm and dry.  Psychiatric: He has a normal mood and affect. His behavior is normal.  Nursing note and vitals reviewed.    ED Treatments / Results  Labs (  all labs ordered are listed, but only abnormal results are displayed) Labs Reviewed  CBC WITH DIFFERENTIAL/PLATELET - Abnormal; Notable for the following components:      Result Value   WBC 14.3 (*)    Hemoglobin 11.9 (*)    MCV 74.8 (*)    MCH 22.2 (*)    MCHC 29.7 (*)    RDW 16.6 (*)    Platelets 639 (*)    All other components within normal limits  BASIC METABOLIC PANEL - Abnormal; Notable for the following components:   Chloride 100 (*)    Glucose, Bld 137 (*)    Calcium 8.8 (*)    All other components within normal limits  URINALYSIS, ROUTINE W REFLEX MICROSCOPIC - Abnormal; Notable for the following components:   Specific Gravity, Urine >1.046  (*)    All other components within normal limits  POC OCCULT BLOOD, ED - Abnormal; Notable for the following components:   Fecal Occult Bld POSITIVE (*)    All other components within normal limits  HIV ANTIBODY (ROUTINE TESTING)  COMPREHENSIVE METABOLIC PANEL  VITAMIN W43  FOLATE  IRON AND TIBC  FERRITIN  RETICULOCYTES  CBC  TYPE AND SCREEN    EKG  EKG Interpretation None       Radiology Ct Abdomen Pelvis W Contrast  Result Date: 06/18/2017 CLINICAL DATA:  Crohn's disease with rectal bleeding x1 year. Patient passed out last week. EXAM: CT ABDOMEN AND PELVIS WITH CONTRAST TECHNIQUE: Multidetector CT imaging of the abdomen and pelvis was performed using the standard protocol following bolus administration of intravenous contrast. CONTRAST:  168m ISOVUE-300 IOPAMIDOL (ISOVUE-300) INJECTION 61% COMPARISON:  11/23/2016 FINDINGS: LOWER CHEST: Lung bases are clear. Included heart size is normal. Small hiatal hernia. No pericardial effusion. HEPATOBILIARY: Liver and gallbladder are normal. PANCREAS: Normal. SPLEEN: Normal. ADRENALS/URINARY TRACT: Kidneys are orthotopic, demonstrating symmetric enhancement. No nephrolithiasis, hydronephrosis or solid renal masses. Urinary bladder is partially distended and unremarkable. Normal adrenal glands. STOMACH/BOWEL: Nondistended stomach with normal small bowel rotation. Mild diffuse distal ileal thickening in the right lower quadrant compatible with changes of ileitis and underlying patient history of Crohn's disease. No pseudo sacculation 0 bowel obstructions. No fistulous connection. Moderate stool burden within nondistended colon. Submucosal fat deposition with mild-to-moderate luminal narrowing of the rectosigmoid compatible which changes of chronic inflammatory bowel disease. Normal appendix. VASCULAR/LYMPHATIC: No aortic aneurysm. Patent branch vessels. No lymphadenopathy. REPRODUCTIVE: Normal size prostate. OTHER: No intraperitoneal free fluid or  free air. MUSCULOSKELETAL: Sacroiliac joints are unremarkable. Slight disc space narrowing L4-5 and L5-S1. No acute nor suspicious osseous lesions. IMPRESSION: 1. Mild luminal narrowing and submucosal fat deposition along the rectosigmoid compatible with changes of chronic inflammatory bowel disease. More active inflammatory change noted of the distal and terminal ileum with moderate transmural thickening noted. 2. No bowel obstruction or free air. 3. No acute solid organ pathology. 4. Mild disc space narrowing L4-5 and L5-S1. Electronically Signed   By: DAshley RoyaltyM.D.   On: 06/18/2017 20:17    Procedures Procedures (including critical care time)  Medications Ordered in ED Medications  0.9 %  sodium chloride infusion ( Intravenous New Bag/Given 06/18/17 2147)  acetaminophen (TYLENOL) tablet 650 mg (not administered)    Or  acetaminophen (TYLENOL) suppository 650 mg (not administered)  ondansetron (ZOFRAN) tablet 4 mg (not administered)    Or  ondansetron (ZOFRAN) injection 4 mg (not administered)  oxyCODONE (Oxy IR/ROXICODONE) immediate release tablet 5 mg (not administered)  morphine 2 MG/ML injection 2 mg (not administered)  ciprofloxacin (CIPRO) IVPB 400 mg (not administered)  metroNIDAZOLE (FLAGYL) IVPB 500 mg (not administered)  methylPREDNISolone sodium succinate (SOLU-MEDROL) 40 mg/mL injection 40 mg (not administered)  sodium chloride 0.9 % bolus 1,000 mL (1,000 mLs Intravenous New Bag/Given 06/18/17 2142)  mesalamine (APRISO) 24 hr capsule 0.375 g (not administered)  sodium chloride 0.9 % bolus 1,000 mL (0 mLs Intravenous Stopped 06/18/17 2048)  ondansetron (ZOFRAN) injection 4 mg (4 mg Intravenous Given 06/18/17 1727)  morphine 4 MG/ML injection 4 mg (4 mg Intravenous Given 06/18/17 1727)  dexamethasone (DECADRON) injection 8 mg (8 mg Intravenous Given 06/18/17 1820)  iopamidol (ISOVUE-300) 61 % injection 100 mL (100 mLs Intravenous Contrast Given 06/18/17 1946)     Initial  Impression / Assessment and Plan / ED Course  I have reviewed the triage vital signs and the nursing notes.  Pertinent labs & imaging results that were available during my care of the patient were reviewed by me and considered in my medical decision making (see chart for details).     Patient with a known history of Crohn's disease presents with lower abdominal pain and bloody stool.  CT scan reveals acute inflammation in the distal ileum and chronic changes in the rectosigmoid area.  Will hydrate, treat pain, start antibiotics.  Admit to general medicine.  Final Clinical Impressions(s) / ED Diagnoses   Final diagnoses:  Crohn's disease with complication, unspecified gastrointestinal tract location [facility])    ED Discharge Orders    None       Nat Christen, MD 06/18/17 2208

## 2017-06-18 NOTE — Telephone Encounter (Signed)
Patient called requesting to talk to Terri, patient stated he needs to discuss his medicine with her. Patient asked to be called at [phone] and ask for him.

## 2017-06-19 ENCOUNTER — Other Ambulatory Visit: Payer: Self-pay

## 2017-06-19 DIAGNOSIS — E43 Unspecified severe protein-calorie malnutrition: Secondary | ICD-10-CM

## 2017-06-19 DIAGNOSIS — D500 Iron deficiency anemia secondary to blood loss (chronic): Secondary | ICD-10-CM

## 2017-06-19 DIAGNOSIS — K50111 Crohn's disease of large intestine with rectal bleeding: Secondary | ICD-10-CM | POA: Diagnosis present

## 2017-06-19 DIAGNOSIS — K50119 Crohn's disease of large intestine with unspecified complications: Secondary | ICD-10-CM | POA: Diagnosis present

## 2017-06-19 LAB — COMPREHENSIVE METABOLIC PANEL
ALBUMIN: 2.5 g/dL — AB (ref 3.5–5.0)
ALT: 15 U/L — ABNORMAL LOW (ref 17–63)
ANION GAP: 9 (ref 5–15)
AST: 12 U/L — ABNORMAL LOW (ref 15–41)
Alkaline Phosphatase: 53 U/L (ref 38–126)
BILIRUBIN TOTAL: 0.5 mg/dL (ref 0.3–1.2)
BUN: 15 mg/dL (ref 6–20)
CO2: 25 mmol/L (ref 22–32)
Calcium: 8.5 mg/dL — ABNORMAL LOW (ref 8.9–10.3)
Chloride: 102 mmol/L (ref 101–111)
Creatinine, Ser: 0.77 mg/dL (ref 0.61–1.24)
GFR calc Af Amer: >60 mL/min (ref >60)
GFR calc non Af Amer: >60 mL/min (ref >60)
GLUCOSE: 144 mg/dL — AB (ref 65–99)
POTASSIUM: 4.4 mmol/L (ref 3.5–5.1)
SODIUM: 136 mmol/L (ref 135–145)
TOTAL PROTEIN: 5.7 g/dL — AB (ref 6.5–8.1)

## 2017-06-19 LAB — CBC
HEMATOCRIT: 33.5 % — AB (ref 39.0–52.0)
Hemoglobin: 10 g/dL — ABNORMAL LOW (ref 13.0–17.0)
MCH: 22.3 pg — AB (ref 26.0–34.0)
MCHC: 29.9 g/dL — AB (ref 30.0–36.0)
MCV: 74.6 fL — AB (ref 78.0–100.0)
PLATELETS: 517 10*3/uL — AB (ref 150–400)
RBC: 4.49 MIL/uL (ref 4.22–5.81)
RDW: 16.4 % — AB (ref 11.5–15.5)
WBC: 9.9 10*3/uL (ref 4.0–10.5)

## 2017-06-19 LAB — IRON AND TIBC
IRON: 15 ug/dL — AB (ref 45–182)
SATURATION RATIOS: 6 % — AB (ref 17.9–39.5)
TIBC: 266 ug/dL (ref 250–450)
UIBC: 251 ug/dL

## 2017-06-19 LAB — VITAMIN B12: Vitamin B-12: 232 pg/mL (ref 180–914)

## 2017-06-19 LAB — FERRITIN: FERRITIN: 14 ng/mL — AB (ref 24–336)

## 2017-06-19 LAB — FOLATE: Folate: 20.6 ng/mL (ref >5.9)

## 2017-06-19 LAB — RETICULOCYTES
RBC.: 4.49 MIL/uL (ref 4.22–5.81)
RETIC CT PCT: 2.9 % (ref 0.4–3.1)
Retic Count, Absolute: 130.2 10*3/uL (ref 19.0–186.0)

## 2017-06-19 LAB — CBG MONITORING, ED: Glucose-Capillary: 139 mg/dL — ABNORMAL HIGH (ref 65–99)

## 2017-06-19 MED ORDER — TUBERCULIN PPD 5 UNIT/0.1ML ID SOLN
5.0000 [IU] | Freq: Once | INTRADERMAL | Status: AC
  Administered 2017-06-19: 5 [IU] via INTRADERMAL
  Filled 2017-06-19: qty 0.1

## 2017-06-19 MED ORDER — ZINC SULFATE 220 (50 ZN) MG PO CAPS
220.0000 mg | ORAL_CAPSULE | Freq: Every day | ORAL | Status: DC
  Administered 2017-06-19 – 2017-06-23 (×5): 220 mg via ORAL
  Filled 2017-06-19 (×5): qty 1

## 2017-06-19 MED ORDER — INFLUENZA VAC SPLIT QUAD 0.5 ML IM SUSY
0.5000 mL | PREFILLED_SYRINGE | INTRAMUSCULAR | Status: AC
  Administered 2017-06-20: 0.5 mL via INTRAMUSCULAR
  Filled 2017-06-19: qty 0.5

## 2017-06-19 MED ORDER — PNEUMOCOCCAL VAC POLYVALENT 25 MCG/0.5ML IJ INJ
0.5000 mL | INJECTION | INTRAMUSCULAR | Status: AC
  Administered 2017-06-20: 0.5 mL via INTRAMUSCULAR
  Filled 2017-06-19: qty 0.5

## 2017-06-19 MED ORDER — METHYLPREDNISOLONE SODIUM SUCC 40 MG IJ SOLR
40.0000 mg | Freq: Two times a day (BID) | INTRAMUSCULAR | Status: DC
  Administered 2017-06-20 – 2017-06-23 (×7): 40 mg via INTRAVENOUS
  Filled 2017-06-19 (×7): qty 1

## 2017-06-19 MED ORDER — AMLODIPINE BESYLATE 5 MG PO TABS
10.0000 mg | ORAL_TABLET | Freq: Every day | ORAL | Status: DC
  Administered 2017-06-19 – 2017-06-23 (×5): 10 mg via ORAL
  Filled 2017-06-19 (×5): qty 2

## 2017-06-19 MED ORDER — ENSURE ENLIVE PO LIQD
237.0000 mL | Freq: Two times a day (BID) | ORAL | Status: DC
  Administered 2017-06-19 – 2017-06-23 (×8): 237 mL via ORAL

## 2017-06-19 NOTE — Progress Notes (Signed)
PROGRESS NOTE                                                                                                                                                                                                             Patient Demographics:    Dwayne Cooper, is a 42 y.o. male, DOB - 06-20-1975, CWC:[id number]  Admit date - 06/18/2017   Admitting Physician Debbe Odea, MD  Outpatient Primary MD for the patient is [facility]  LOS - 1  Outpatient Specialists: Dr Dereck Leep  Chief Complaint  Patient presents with  . GI Bleeding       Brief Narrative  42 year old male with history of Crohn's disease diagnosed in June 2018 presented with bloody diarrhea and abdominal pain.  Symptoms have been going on for about 3 weeks and has been on prednisone taper, Cipro and Flagyl by GI without much improvement.  He reports having 8-10 stools per day since last 5 days with diffuse lower abdominal pain as if being pressed by a hot iron rod.  He reports having very poor appetite for the last several days and also having intermittent vomiting.  In the ED he had CT of the abdomen and pelvis showing active inflammatory changes in the distal and terminal ileum with a moderate transmural thickening. Patient admitted for acute flareup of his Crohn's disease.   Subjective:   Reports having 2 episodes of diarrhea since last night.  Denies any vomiting today.  Assessment  & Plan :   Principal problem Crohn's disease with acute flareup Continue IV hydration, IV Solu-Medrol, empiric ciprofloxacin and Flagyl.  Continue mesalamine.  GI consulted.  PPD ordered for possibly plan on starting biological.  Further recommendations per GI. -Serial abdominal exams.  Cytosis resolved.  Anemia Secondary to blood loss.  Monitor H&H closely.  Follow iron panel.  Dehydration Secondary to ongoing diarrhea and vomiting Monitor with IV  fluids.  Essential hypertension Resume amlodipine.   Code Status : Full code  Family Communication: None at bedside  Disposition Plan  : Home once improved  Barriers For Discharge : Active symptoms  Consults  : GI  Procedures  : CT abdomen and pelvis  DVT Prophylaxis  :  Lovenox -  Lab Results  Component Value Date   PLT 517 (H) 06/19/2017    Antibiotics  :  Anti-infectives (From admission, onward)   Start     Dose/Rate Route Frequency Ordered Stop   06/18/17 2300  metroNIDAZOLE (FLAGYL) IVPB 500 mg     500 mg 100 mL/hr over 60 Minutes Intravenous Every 8 hours 06/18/17 2137     06/18/17 2200  ciprofloxacin (CIPRO) IVPB 400 mg     400 mg 200 mL/hr over 60 Minutes Intravenous Every 12 hours 06/18/17 2137          Objective:   Vitals:   06/19/17 0700 06/19/17 0800 06/19/17 0806 06/19/17 0840  BP: 132/85 (!) 156/100 (!) 143/78 (!) 150/87  Pulse: 89 (!) 106 97 (!) 104  Resp:   18 15  Temp:   97.7 F (36.5 C) (!) 97.5 F (36.4 C)  TempSrc:   Oral Oral  SpO2: 93% 99% 98% 100%  Weight:    106.9 kg (235 lb 9.6 oz)  Height:        Wt Readings from Last 3 Encounters:  06/19/17 106.9 kg (235 lb 9.6 oz)  05/31/17 114.7 kg (252 lb 14.4 oz)  03/07/17 118.3 kg (260 lb 11.2 oz)     Intake/Output Summary (Last 24 hours) at 06/19/2017 0932 Last data filed at 06/19/2017 0844 Gross per 24 hour  Intake 3300 ml  Output 2 ml  Net 3298 ml     Physical Exam  Gen: not in distress, appears fatigued HEENT: Pallor present, dry mucosa, supple neck Chest: clear b/l, no added sounds CVS: N S1&S2, no murmurs,  GI: soft, nondistended, bowel sounds present, lower abdominal tenderness to pressure Musculoskeletal: warm, no edema     Data Review:    CBC Recent Labs  Lab 06/18/17 1423 06/19/17 0606  WBC 14.3* 9.9  HGB 11.9* 10.0*  HCT 40.1 33.5*  PLT 639* 517*  MCV 74.8* 74.6*  MCH 22.2* 22.3*  MCHC 29.7* 29.9*  RDW 16.6* 16.4*  LYMPHSABS 0.9  --   MONOABS  0.6  --   EOSABS 0.0  --   BASOSABS 0.0  --     Chemistries  Recent Labs  Lab 06/18/17 1423 06/19/17 0606  NA 139 136  K 4.3 4.4  CL 100* 102  CO2 25 25  GLUCOSE 137* 144*  BUN 18 15  CREATININE 1.09 0.77  CALCIUM 8.8* 8.5*  AST  --  12*  ALT  --  15*  ALKPHOS  --  53  BILITOT  --  0.5   ------------------------------------------------------------------------------------------------------------------ No results for input(s): CHOL, HDL, LDLCALC, TRIG, CHOLHDL, LDLDIRECT in the last 72 hours.  No results found for: HGBA1C ------------------------------------------------------------------------------------------------------------------ No results for input(s): TSH, T4TOTAL, T3FREE, THYROIDAB in the last 72 hours.  Invalid input(s): FREET3 ------------------------------------------------------------------------------------------------------------------ Recent Labs    06/19/17 0606  RETICCTPCT 2.9    Coagulation profile No results for input(s): INR, PROTIME in the last 168 hours.  No results for input(s): DDIMER in the last 72 hours.  Cardiac Enzymes No results for input(s): CKMB, TROPONINI, MYOGLOBIN in the last 168 hours.  Invalid input(s): CK ------------------------------------------------------------------------------------------------------------------ No results found for: BNP  Inpatient Medications  Scheduled Meds: . mesalamine  0.375 g Oral Daily  . methylPREDNISolone (SOLU-MEDROL) injection  40 mg Intravenous Q6H  . tuberculin  5 Units Intradermal Once   Continuous Infusions: . sodium chloride 125 mL/hr at 06/19/17 0844  . ciprofloxacin Stopped (06/19/17 0033)  . metronidazole Stopped (06/19/17 0844)   PRN Meds:.acetaminophen **OR** acetaminophen, morphine injection, ondansetron **OR** ondansetron (ZOFRAN) IV, oxyCODONE  Micro Results No results found for this  or any previous visit (from the past 240 hour(s)).  Radiology Reports Ct Abdomen  Pelvis W Contrast  Result Date: 06/18/2017 CLINICAL DATA:  Crohn's disease with rectal bleeding x1 year. Patient passed out last week. EXAM: CT ABDOMEN AND PELVIS WITH CONTRAST TECHNIQUE: Multidetector CT imaging of the abdomen and pelvis was performed using the standard protocol following bolus administration of intravenous contrast. CONTRAST:  174m ISOVUE-300 IOPAMIDOL (ISOVUE-300) INJECTION 61% COMPARISON:  11/23/2016 FINDINGS: LOWER CHEST: Lung bases are clear. Included heart size is normal. Small hiatal hernia. No pericardial effusion. HEPATOBILIARY: Liver and gallbladder are normal. PANCREAS: Normal. SPLEEN: Normal. ADRENALS/URINARY TRACT: Kidneys are orthotopic, demonstrating symmetric enhancement. No nephrolithiasis, hydronephrosis or solid renal masses. Urinary bladder is partially distended and unremarkable. Normal adrenal glands. STOMACH/BOWEL: Nondistended stomach with normal small bowel rotation. Mild diffuse distal ileal thickening in the right lower quadrant compatible with changes of ileitis and underlying patient history of Crohn's disease. No pseudo sacculation 0 bowel obstructions. No fistulous connection. Moderate stool burden within nondistended colon. Submucosal fat deposition with mild-to-moderate luminal narrowing of the rectosigmoid compatible which changes of chronic inflammatory bowel disease. Normal appendix. VASCULAR/LYMPHATIC: No aortic aneurysm. Patent branch vessels. No lymphadenopathy. REPRODUCTIVE: Normal size prostate. OTHER: No intraperitoneal free fluid or free air. MUSCULOSKELETAL: Sacroiliac joints are unremarkable. Slight disc space narrowing L4-5 and L5-S1. No acute nor suspicious osseous lesions. IMPRESSION: 1. Mild luminal narrowing and submucosal fat deposition along the rectosigmoid compatible with changes of chronic inflammatory bowel disease. More active inflammatory change noted of the distal and terminal ileum with moderate transmural thickening noted. 2. No bowel  obstruction or free air. 3. No acute solid organ pathology. 4. Mild disc space narrowing L4-5 and L5-S1. Electronically Signed   By: DAshley RoyaltyM.D.   On: 06/18/2017 20:17    Time Spent in minutes  25   Lui Bellis M.D on 06/19/2017 at 9:32 AM  Between 7am to 7pm - Pager - [phone] After 7pm go to [email or web address] - password [facility] -  Office  [phone]

## 2017-06-19 NOTE — Progress Notes (Signed)
Initial Nutrition Assessment  DOCUMENTATION CODES:  Severe malnutrition in context of acute illness/injury  INTERVENTION:  Received Verbal Order for diet advancement to Full liquids  Ensure Enlive po BID, each supplement provides 350 kcal and 20 grams of protein  Given patients severe diarrhea x3 weeks. Would recommend ZINC supplementation due to volume of stool output. ASPEN Recs:12 mg/day.   Due to ongoing use of low dose steroids, Upon discharge, would recommend Calcium 500 mg  And Vit D 400 IU/day (if not already taking)   Reviewed diet for IBD/Crohns flare. Provided handouts.   NUTRITION DIAGNOSIS:  Severe Malnutrition related to acute illness, altered GI function(Crohns Flare) as evidenced by loss of 6.8% bw x 3 weeks and as estimated energy intake that has met </= to 50% of needs for >/= to 5 days.  GOAL:  Patient will meet greater than or equal to 90% of their needs  MONITOR:  PO intake, Supplement acceptance, Diet advancement, Labs, Weight trends, I & O's  REASON FOR ASSESSMENT:  Consult(Verbal) Assessment of nutrition requirement/status  ASSESSMENT:  42 y/o male PMHx HTN and recently diagnosed crohns disease (July 2018). Presented to ED with w/ bloody diarrhea (8-10/d), vomiting and abdominal pain x3 weeks refractory to steroids and outpatient abx.  Admitted for management.   Patient seen alone in room, fairly well-appearing.   He reports he has been struggling with Crohns for ~ 1 year, though it wasn't diagnosed until this past summer. This specific flare has been going on for ~2.5-3.5 weeks. During this time, he has been avoiding high fiber/fat foods as had been instructed by GI office. He has been drinking Ensure/Boost to avoid eating "heavier meals". He reports that, up until this past Friday, he had been able to eat/drink as he normally does. He did not have any pain with intake or nausea, rather just every time he would eat, "it would go right through me".    However, on this past Friday, his pain/diarrhea worsened to the point he felt extremely weak, which prompted him to go to the ED. He says since last Friday, he has only consumed toast/broth.   At baseline, he takes 2 gummy mvis and a centrum mvi.   He reports a UBW of 255 lbs. He is ~20 lbs less than this now. Chart history corroborates this report as he was weighed at 253 lbs just 3 weeks ago.   Meets severe malnutrition criteria in acute context based upon degree of weight loss and minimal intake x 5 days.   At this time, he is tolerating clear liquids. Given his malnourished state, would like to have diet advanced to provide more kcals/pro. He feels like he would do well on full liquids. Spoke with MD and got verbal order to advance to Full liquids. He was also agreeable to supplements.   Furthermore, reviewed Crohns Vitamin/Mineral recommendations. Touched on Zinc. He HAS had taste changes, but this could also be related to abx. Left handout with recommendations, titled "Inflammatory Bowel Disease (IBD): Crohn's Disease and Ulcerative Colitis Nutrition Therapy"  He notes he has no insurance and has been struggling with drug coverage. Will see if CSW has anything they could offer.  Physical Exam: Mild orbital fat wasting. Pallor. Mild muscle wasting of lower extremities.   Labs: Albumin: 2.5, Glu:135-150 Meds: Mesalamine, methylprednisolone, IV abx, IVF  Recent Labs  Lab 06/18/17 1423 06/19/17 0606  NA 139 136  K 4.3 4.4  CL 100* 102  CO2 25 25  BUN 18 15  CREATININE 1.09 0.77  CALCIUM 8.8* 8.5*  GLUCOSE 137* 144*   NUTRITION - FOCUSED PHYSICAL EXAM:   Most Recent Value  Orbital Region  Mild depletion  Upper Arm Region  No depletion  Thoracic and Lumbar Region  No depletion  Buccal Region  No depletion  Temple Region  No depletion  Clavicle Bone Region  No depletion  Clavicle and Acromion Bone Region  No depletion  Scapular Bone Region  No depletion  Dorsal Hand  No  depletion  Patellar Region  No depletion  Anterior Thigh Region  Mild depletion  Posterior Calf Region  Mild depletion  Edema (RD Assessment)  Mild  Skin  Reviewed      Diet Order:  Diet clear liquid Room service appropriate? Yes; Fluid consistency: Thin  EDUCATION NEEDS:  Education needs have been addressed  Skin:  Skin Assessment: Reviewed RN Assessment  Last BM:  2/19  Height:  Ht Readings from Last 1 Encounters:  06/18/17 _0  (1.93 m)   Weight:  Wt Readings from Last 1 Encounters:  06/19/17 235 lb 9.6 oz (106.9 kg)   Wt Readings from Last 10 Encounters:  06/19/17 235 lb 9.6 oz (106.9 kg)  05/31/17 252 lb 14.4 oz (114.7 kg)  03/07/17 260 lb 11.2 oz (118.3 kg)  01/03/17 239 lb 9.6 oz (108.7 kg)   Ideal Body Weight:  91.82 kg  BMI:  Body mass index is 28.68 kg/m.  Estimated Nutritional Needs:  Kcal:  6516-8610 kcals (23-25 kcal/kg bw) Protein:  120-138 (1.3-1.5g/kg ibw) Fluid:  2.4-2.6 Liters + Enough to replace stool losses  Burtis Junes RD, LDN, CNSC Clinical Nutrition Pager: [phone] 06/19/2017 12:37 PM

## 2017-06-19 NOTE — Consult Note (Signed)
Reason for Consult: Crohn's  Referring Physician:    [name] is an 42 y.o. male.  HPI: Patient is presently in holding in the ED. Does have a room assigned (202). Presented to the ED yesterday with c/o rectal bleeding, lower abdominal pain, extreme weakness. New diagnosis of Crohn's disease. He underwent a CT which revealed  IMPRESSION: 1. Mild luminal narrowing and submucosal fat deposition along the rectosigmoid compatible with changes of chronic inflammatory bowel disease. More active inflammatory change noted of the distal and terminal ileum with moderate transmural thickening noted. 2. No bowel obstruction or free air.  Over the weekend he states he was having at least 10 loose stools. He says he ate toast with jelly Saturday and chicken soap to slow his BMs down. He also supplemented his diet with Ensure. He did notice blood in his stool.  He was seen in the office 05/31/2017 with Crohn's flare. He was to get a PPD. He was on a Prednisone taper and at admission was taking 65m daily. He was started on Cipro and Flagyl 06/11/2017. Orginally seen in [facility] patient 01/03/2018. [facility] and [facility], and abdominal pain. Had had symptoms for about a year.  He underwent a colonoscopy 11/25/2017  which revealed hemorrhoids.   Per Dr. DAnthony Sarnotes,  inflammatory changes were consistent with Crohn's colitis.  Underwent a colonoscopy 11/25/2016 for rectal bleeding by Dr. DAnthony Sarwhich revealed.  Circumferential diffuse abnormal mucosa was found thru out the entire examined colon. The mucosa had deep ulcers, scarring and granularity.  Biopsy: Colon: Acute colitis. No dysplasia or malignancy identified. Rectum biopsy: chronic active colitis. No dysplasia or malignancy identified.       Past Medical History:  Diagnosis Date  . [place]'s [place] 01/03/[place]  . Essential hypertension, benign 01/03/2017  . Rectal bleeding    chronic     History reviewed. No pertinent surgical history.  History reviewed. No pertinent family history.  Social History:  reports that  has never smoked. he has never used smokeless tobacco. He reports that he does not drink alcohol or use drugs.   Has 2 children. Has a significant other.  Works at [employer] Does not have insurance. [place] [place]  Allergies: No Known Allergies     Medications: I have reviewed the patient's current medications.  Results for orders placed or performed during the hospital encounter of 06/18/17 (from the past 48 hour(s))  CBC with Differential     Status: Abnormal   Collection Time: 06/18/17  2:23 PM  Result Value Ref Range   WBC 14.3 (H) 4.0 - 10.5 K/uL   RBC 5.36 4.22 - 5.81 MIL/uL   Hemoglobin 11.9 (L) 13.0 - 17.0 g/dL   HCT 40.1 39.0 - 52.0 %   MCV 74.8 (L) 78.0 - 100.0 fL   MCH 22.2 (L) 26.0 - 34.0 pg   MCHC 29.7 (L) 30.0 - 36.0 g/dL   RDW 16.6 (H) 11.5 - 15.5 %   Platelets 639 (H) 150 - 400 K/uL   Neutrophils Relative % 90 %   Neutro Abs 12.8 1.7 - 7.7 K/uL   Lymphocytes Relative 6 %   Lymphs Abs 0.9 0.7 - 4.0 K/uL   Monocytes Relative 4 %   Monocytes Absolute 0.6 0.1 - 1.0 K/uL   Eosinophils Relative 0 %   Eosinophils Absolute 0.0 0.0 - 0.7 K/uL   Basophils Relative 0 %  Basophils Absolute 0.0 0.0 - 0.1 K/uL    Comment: Performed at [facility], [address] [place], [place] [address]  Basic metabolic panel     Status: Abnormal   Collection Time: 06/18/17  2:23 PM  Result Value Ref Range   Sodium 139 135 - 145 mmol/L   Potassium 4.3 3.5 - 5.1 mmol/L   Chloride 100 (L) 101 - 111 mmol/L   CO2 25 22 - 32 mmol/L   Glucose, Bld 137 (H) 65 - 99 mg/dL   BUN 18 6 - 20 mg/dL   Creatinine, Ser 1.09 0.61 - 1.24 mg/dL   Calcium 8.8 (L) 8.9 - 10.3 mg/dL   GFR calc non Af Amer >60 >60 mL/min   GFR calc Af Amer >60 >60 mL/min    Comment: (NOTE) The eGFR has been calculated using the CKD EPI  equation. This calculation has not been validated in all clinical situations. eGFR's persistently <60 mL/min signify possible Chronic Kidney Disease.    Anion gap 14 5 - 15    Comment: Performed at [facility], [address] [place], [place] [address]  Type and screen [facility]     Status: None   Collection Time: 06/18/17  2:23 PM  Result Value Ref Range   ABO/RH(D) O POS    Antibody Screen NEG    Sample Expiration      06/21/2017 Performed at [facility], [address] [place], [place] [address]   POC occult blood, ED     Status: Abnormal   Collection Time: 06/18/17  4:58 PM  Result Value Ref Range   Fecal Occult Bld POSITIVE (A) NEGATIVE  Urinalysis, Routine w reflex microscopic     Status: Abnormal   Collection Time: 06/18/17  5:15 PM  Result Value Ref Range   Color, Urine YELLOW YELLOW   APPearance CLEAR CLEAR   Specific Gravity, Urine >1.046 (H) 1.005 - 1.030   pH 6.0 5.0 - 8.0   Glucose, UA NEGATIVE NEGATIVE mg/dL   Hgb urine dipstick NEGATIVE NEGATIVE   Bilirubin Urine NEGATIVE NEGATIVE   Ketones, ur NEGATIVE NEGATIVE mg/dL   Protein, ur NEGATIVE NEGATIVE mg/dL   Nitrite NEGATIVE NEGATIVE   Leukocytes, UA NEGATIVE NEGATIVE    Comment: Performed at [facility], [address] [place], [place] [address]  CBG monitoring, ED     Status: Abnormal   Collection Time: 06/19/17 12:41 AM  Result Value Ref Range   Glucose-Capillary 139 (H) 65 - 99 mg/dL  Comprehensive metabolic panel     Status: Abnormal   Collection Time: 06/19/17  6:06 AM  Result Value Ref Range   Sodium 136 135 - 145 mmol/L   Potassium 4.4 3.5 - 5.1 mmol/L   Chloride 102 101 - 111 mmol/L   CO2 25 22 - 32 mmol/L   Glucose, Bld 144 (H) 65 - 99 mg/dL   BUN 15 6 - 20 mg/dL   Creatinine, Ser 0.77 0.61 - 1.24 mg/dL   Calcium 8.5 (L) 8.9 - 10.3 mg/dL   Total Protein 5.7 (L) 6.5 - 8.1 g/dL   Albumin 2.5 (L) 3.5 - 5.0 g/dL   AST 12 (L) 15 - 41 U/L   ALT 15 (L) 17 - 63 U/L   Alkaline  Phosphatase 53 38 - 126 U/L   Total Bilirubin 0.5 0.3 - 1.2 mg/dL   GFR calc non Af Amer >60 >60 mL/min   GFR calc Af Amer >60 >60 mL/min    Comment: (NOTE) The  eGFR has been calculated using the CKD EPI equation. This calculation has not been validated in all clinical situations. eGFR's persistently <60 mL/min signify possible Chronic Kidney Disease.    Anion gap 9 5 - 15    Comment: Performed at [facility], [address] [place], [place] [address]  Reticulocytes     Status: None   Collection Time: 06/19/17  6:06 AM  Result Value Ref Range   Retic Ct Pct 2.9 0.4 - 3.1 %   RBC. 4.49 4.22 - 5.81 MIL/uL   Retic Count, Absolute 130.2 19.0 - 186.0 K/uL    Comment: Performed at [facility], [address], [place], [place] [address]  CBC     Status: Abnormal   Collection Time: 06/19/17  6:06 AM  Result Value Ref Range   WBC 9.9 4.0 - 10.5 K/uL   RBC 4.49 4.22 - 5.81 MIL/uL   Hemoglobin 10.0 (L) 13.0 - 17.0 g/dL   HCT 33.5 (L) 39.0 - 52.0 %   MCV 74.6 (L) 78.0 - 100.0 fL   MCH 22.3 (L) 26.0 - 34.0 pg   MCHC 29.9 (L) 30.0 - 36.0 g/dL   RDW 16.4 (H) 11.5 - 15.5 %   Platelets 517 (H) 150 - 400 K/uL    Comment: Performed at [facility], [address], [place], [place] [address]    Ct Abdomen Pelvis W Contrast  Result Date: 06/18/2017 CLINICAL DATA:  Crohn's disease with rectal bleeding x1 year. Patient passed out last week. EXAM: CT ABDOMEN AND PELVIS WITH CONTRAST TECHNIQUE: Multidetector CT imaging of the abdomen and pelvis was performed using the standard protocol following bolus administration of intravenous contrast. CONTRAST:  162m ISOVUE-300 IOPAMIDOL (ISOVUE-300) INJECTION 61% COMPARISON:  11/23/2016 FINDINGS: LOWER CHEST: Lung bases are clear. Included heart size is normal. Small hiatal hernia. No pericardial effusion. HEPATOBILIARY: Liver and gallbladder are normal. PANCREAS: Normal. SPLEEN: Normal. ADRENALS/URINARY TRACT: Kidneys are orthotopic, demonstrating symmetric  enhancement. No nephrolithiasis, hydronephrosis or solid renal masses. Urinary bladder is partially distended and unremarkable. Normal adrenal glands. STOMACH/BOWEL: Nondistended stomach with normal small bowel rotation. Mild diffuse distal ileal thickening in the right lower quadrant compatible with changes of ileitis and underlying patient history of Crohn's disease. No pseudo sacculation 0 bowel obstructions. No fistulous connection. Moderate stool burden within nondistended colon. Submucosal fat deposition with mild-to-moderate luminal narrowing of the rectosigmoid compatible which changes of chronic inflammatory bowel disease. Normal appendix. VASCULAR/LYMPHATIC: No aortic aneurysm. Patent branch vessels. No lymphadenopathy. REPRODUCTIVE: Normal size prostate. OTHER: No intraperitoneal free fluid or free air. MUSCULOSKELETAL: Sacroiliac joints are unremarkable. Slight disc space narrowing L4-5 and L5-S1. No acute nor suspicious osseous lesions. IMPRESSION: 1. Mild luminal narrowing and submucosal fat deposition along the rectosigmoid compatible with changes of chronic inflammatory bowel disease. More active inflammatory change noted of the distal and terminal ileum with moderate transmural thickening noted. 2. No bowel obstruction or free air. 3. No acute solid organ pathology. 4. Mild disc space narrowing L4-5 and L5-S1. Electronically Signed   By: DAshley RoyaltyM.D.   On: 06/18/2017 20:17    ROS Blood pressure (!) 156/100, pulse (!) 106, temperature 97.9 F (36.6 C), temperature source Oral, resp. rate 17, height 6' 4"  (1.93 m), weight 240 lb (108.9 kg), SpO2 99 %. Physical Exam Alert and oriented. Skin warm and dry. Oral mucosa is moist.   . Sclera anicteric, conjunctivae is pink. Thyroid not enlarged. No cervical lymphadenopathy. Lungs clear. Heart regular rate and rhythm.  Abdomen is soft. Bowel  sounds are positive. No hepatomegaly. No abdominal masses felt. Lower abdominal tenderness.   No edema to  lower extremities.   .    Assessment/Plan: Crohn's flare. Agree with Cipro and Flagyl. Agree with Clear liquid diet.  Will discuss with Dr Laural Golden. PPD ordered.  Dwayne Cooper 06/19/2017, 8:05 AM

## 2017-06-19 NOTE — Progress Notes (Signed)
Patient admitted from ED. In no apparent distress. Dr. Clementeen Graham in to see patient.

## 2017-06-20 DIAGNOSIS — K50919 Crohn's disease, unspecified, with unspecified complications: Secondary | ICD-10-CM

## 2017-06-20 LAB — BASIC METABOLIC PANEL
Anion gap: 10 (ref 5–15)
BUN: 15 mg/dL (ref 6–20)
CALCIUM: 8.3 mg/dL — AB (ref 8.9–10.3)
CO2: 24 mmol/L (ref 22–32)
Chloride: 103 mmol/L (ref 101–111)
Creatinine, Ser: 0.8 mg/dL (ref 0.61–1.24)
GFR calc Af Amer: >60 mL/min (ref >60)
GLUCOSE: 126 mg/dL — AB (ref 65–99)
Potassium: 3.9 mmol/L (ref 3.5–5.1)
Sodium: 137 mmol/L (ref 135–145)

## 2017-06-20 LAB — CBC
HEMATOCRIT: 32 % — AB (ref 39.0–52.0)
Hemoglobin: 9.6 g/dL — ABNORMAL LOW (ref 13.0–17.0)
MCH: 22.5 pg — AB (ref 26.0–34.0)
MCHC: 30 g/dL (ref 30.0–36.0)
MCV: 74.9 fL — ABNORMAL LOW (ref 78.0–100.0)
PLATELETS: 534 10*3/uL — AB (ref 150–400)
RBC: 4.27 MIL/uL (ref 4.22–5.81)
RDW: 16.3 % — AB (ref 11.5–15.5)
WBC: 14.5 10*3/uL — ABNORMAL HIGH (ref 4.0–10.5)

## 2017-06-20 LAB — HIV ANTIBODY (ROUTINE TESTING W REFLEX): HIV SCREEN 4TH GENERATION: NONREACTIVE

## 2017-06-20 MED ORDER — METRONIDAZOLE 500 MG PO TABS
500.0000 mg | ORAL_TABLET | Freq: Three times a day (TID) | ORAL | Status: DC
  Administered 2017-06-20 – 2017-06-22 (×5): 500 mg via ORAL
  Filled 2017-06-20 (×5): qty 1

## 2017-06-20 MED ORDER — FERUMOXYTOL INJECTION 510 MG/17 ML
510.0000 mg | Freq: Once | INTRAVENOUS | Status: AC
  Administered 2017-06-20: 510 mg via INTRAVENOUS
  Filled 2017-06-20: qty 17

## 2017-06-20 MED ORDER — CIPROFLOXACIN HCL 250 MG PO TABS
500.0000 mg | ORAL_TABLET | Freq: Two times a day (BID) | ORAL | Status: DC
  Administered 2017-06-20 – 2017-06-22 (×4): 500 mg via ORAL
  Filled 2017-06-20 (×4): qty 2

## 2017-06-20 NOTE — Progress Notes (Addendum)
Patient feels better. His main complaint is 1 of rectal burning after every bowel movement and it would last for several minutes.  He has had 3 bowel movements today.  The stools are loose.  Pain across lower abdomen is mild and unchanged. His appetite is normal.  He denies nausea or vomiting.  Lab studies reviewed. GI pathogen panel is negative. Hemoglobin has dropped to 9.6 g. Iron studies consistent with iron deficiency anemia. Patient would benefit from parenteral iron as I would like to hold off starting on p.o. iron until acute symptoms have resolved.  Single dose of Feraheme 510 mg iron IV be administered today. IV fluid rate decreased to 75 mL/h.

## 2017-06-20 NOTE — Progress Notes (Signed)
Patient ID: Dwayne Cooper, male   DOB: 03/20/1976, 42 y.o.   MRN: [id number] Tolerating full liquid diet this morning. No nausea or vomiting. Has had 2 BMs (loose) this am. Had 5 yesterday. C/o lower abdominal pain with his BMs usually last about 30 minutes after his BMs.  Stools are brownish red. Slight drop in his hemoglobin.  Has received PPD.  GI pathogen pending Hepatic Function Latest Ref Rng & Units 06/19/2017  Total Protein 6.5 - 8.1 g/dL 5.7(L)  Albumin 3.5 - 5.0 g/dL 2.5(L)  AST 15 - 41 U/L 12(L)  ALT 17 - 63 U/L 15(L)  Alk Phosphatase 38 - 126 U/L 53  Total Bilirubin 0.3 - 1.2 mg/dL 0.5    CBC    Component Value Date/Time   WBC 14.5 (H) 06/20/2017 0539   RBC 4.27 06/20/2017 0539   HGB 9.6 (L) 06/20/2017 0539   HCT 32.0 (L) 06/20/2017 0539   PLT 534 (H) 06/20/2017 0539   MCV 74.9 (L) 06/20/2017 0539   MCH 22.5 (L) 06/20/2017 0539   MCHC 30.0 06/20/2017 0539   RDW 16.3 (H) 06/20/2017 0539   LYMPHSABS 0.9 06/18/2017 1423   MONOABS 0.6 06/18/2017 1423   EOSABS 0.0 06/18/2017 1423   BASOSABS 0.0 06/18/2017 1423    Blood pressure 118/69, pulse 97, temperature 97.7 F (36.5 C), temperature source Oral, resp. rate 20, height 6' 4"  (1.93 m), weight 235 lb 9.6 oz (106.9 kg), SpO2 98 %.  .    Assessment: Crohn's exacerbation. Will continue to follow.  GI pathogen pending.

## 2017-06-20 NOTE — Progress Notes (Addendum)
PROGRESS NOTE    Dwayne Cooper  RWE:[id number] DOB: 1975-07-20 DOA: 06/18/2017 PCP: [facility] [place]     Brief Narrative:  42 year old man admitted from home on 2/18 with rectal bleeding and diarrhea.  He has a history of Crohn's disease but has been unfortunately managed suboptimally given lack of health insurance and medical follow-up.  GI is on board.   Assessment & Plan:   Active Problems:   Essential hypertension, benign   Crohn's colitis ([place])   Crohn disease ([place])   Crohn's colitis, with rectal bleeding ([place])   Iron deficiency anemia due to chronic blood loss   Protein-calorie malnutrition, severe   Crohn's disease with acute flareup -Continue IV Solu-Medrol as dosed by GI. -Unfortunately, looks like stool pathogen panel was never sent, will send this afternoon, transition Cipro and Flagyl to oral route. -GI is working on approval for Humira. -Still with crampy abdominal pain and bloody diarrhea.  Iron deficiency anemia due to chronic blood loss -Hemoglobin down to 9.6 today. -GI has ordered parenteral iron in the form of Feraheme to be given today.   DVT prophylaxis: SCDs Code Status: Full code Family Communication: Patient only Disposition Plan: Pending final GI recommendations and symptomatic improvement  Consultants:   GI  Procedures:   None  Antimicrobials:  Anti-infectives (From admission, onward)   Start     Dose/Rate Route Frequency Ordered Stop   06/18/17 2300  metroNIDAZOLE (FLAGYL) IVPB 500 mg     500 mg 100 mL/hr over 60 Minutes Intravenous Every 8 hours 06/18/17 2137     06/18/17 2200  ciprofloxacin (CIPRO) IVPB 400 mg     400 mg 200 mL/hr over 60 Minutes Intravenous Every 12 hours 06/18/17 2137         Subjective: Still with crampy abdominal pain, has had four episodes of bloody diarrhea today.  Objective: Vitals:   06/19/17 1501 06/19/17 2200 06/20/17 0600 06/20/17 1320  BP: 120/71 130/63 118/69 114/61  Pulse:  99 96 97 99  Resp: 16 20  18   Temp: 97.9 F (36.6 C) 97.9 F (36.6 C) 97.7 F (36.5 C) 97.8 F (36.6 C)  TempSrc: Oral Oral Oral Oral  SpO2: 97% 96% 98% 98%  Weight:      Height:        Intake/Output Summary (Last 24 hours) at 06/20/2017 1737 Last data filed at 06/20/2017 1200 Gross per 24 hour  Intake 3838.33 ml  Output 800 ml  Net 3038.33 ml   Filed Weights   06/18/17 1345 06/19/17 0840  Weight: 108.9 kg (240 lb) 106.9 kg (235 lb 9.6 oz)    Examination:  General exam: Alert, awake, oriented x 3 Respiratory system: Clear to auscultation. Respiratory effort normal. Cardiovascular system:RRR. No murmurs, rubs, gallops. Gastrointestinal system: Abdomen is nondistended, soft and nontender. No organomegaly or masses felt. Normal bowel sounds heard. Central nervous system: Alert and oriented. No focal neurological deficits. Extremities: No C/C/E, +pedal pulses Skin: No rashes, lesions or ulcers Psychiatry: Judgement and insight appear normal. Mood & affect appropriate.     Data Reviewed: I have personally reviewed following labs and imaging studies  CBC: Recent Labs  Lab 06/18/17 1423 06/19/17 0606 06/20/17 0539  WBC 14.3* 9.9 14.5*  NEUTROABS 12.8  --   --   HGB 11.9* 10.0* 9.6*  HCT 40.1 33.5* 32.0*  MCV 74.8* 74.6* 74.9*  PLT 639* 517* 619*   Basic Metabolic Panel: Recent Labs  Lab 06/18/17 1423 06/19/17 0606 06/20/17 0539  NA 139 136  137  K 4.3 4.4 3.9  CL 100* 102 103  CO2 25 25 24   GLUCOSE 137* 144* 126*  BUN 18 15 15   CREATININE 1.09 0.77 0.80  CALCIUM 8.8* 8.5* 8.3*   GFR: Estimated Creatinine Clearance: 159.6 mL/min (by C-G formula based on SCr of 0.8 mg/dL). Liver Function Tests: Recent Labs  Lab 06/19/17 0606  AST 12*  ALT 15*  ALKPHOS 53  BILITOT 0.5  PROT 5.7*  ALBUMIN 2.5*   No results for input(s): LIPASE, AMYLASE in the last 168 hours. No results for input(s): AMMONIA in the last 168 hours. Coagulation Profile: No results  for input(s): INR, PROTIME in the last 168 hours. Cardiac Enzymes: No results for input(s): CKTOTAL, CKMB, CKMBINDEX, TROPONINI in the last 168 hours. BNP (last 3 results) No results for input(s): PROBNP in the last 8760 hours. HbA1C: No results for input(s): HGBA1C in the last 72 hours. CBG: Recent Labs  Lab 06/19/17 0041  GLUCAP 139*   Lipid Profile: No results for input(s): CHOL, HDL, LDLCALC, TRIG, CHOLHDL, LDLDIRECT in the last 72 hours. Thyroid Function Tests: No results for input(s): TSH, T4TOTAL, FREET4, T3FREE, THYROIDAB in the last 72 hours. Anemia Panel: Recent Labs    06/19/17 0606  VITAMINB12 232  FOLATE 20.6  FERRITIN 14*  TIBC 266  IRON 15*  RETICCTPCT 2.9   Urine analysis:    Component Value Date/Time   COLORURINE YELLOW 06/18/2017 [place] 06/18/2017 1715   LABSPEC >1.046 (H) 06/18/2017 1715   PHURINE 6.0 06/18/2017 1715   GLUCOSEU NEGATIVE 06/18/2017 1715   HGBUR NEGATIVE 06/18/2017 1715   [place] 06/18/2017 1715   KETONESUR NEGATIVE 06/18/2017 1715   PROTEINUR NEGATIVE 06/18/2017 1715   NITRITE NEGATIVE 06/18/2017 1715   LEUKOCYTESUR NEGATIVE 06/18/2017 1715   Sepsis Labs: @LABRCNTIP (procalcitonin:4,lacticidven:4)  )No results found for this or any previous visit (from the past 240 hour(s)).       Radiology Studies: Ct Abdomen Pelvis W Contrast  Result Date: 06/18/2017 CLINICAL DATA:  Crohn's disease with rectal bleeding x1 year. Patient passed out last week. EXAM: CT ABDOMEN AND PELVIS WITH CONTRAST TECHNIQUE: Multidetector CT imaging of the abdomen and pelvis was performed using the standard protocol following bolus administration of intravenous contrast. CONTRAST:  136m ISOVUE-300 IOPAMIDOL (ISOVUE-300) INJECTION 61% COMPARISON:  11/23/2016 FINDINGS: LOWER CHEST: Lung bases are clear. Included heart size is normal. Small hiatal hernia. No pericardial effusion. HEPATOBILIARY: Liver and gallbladder are normal.  PANCREAS: Normal. SPLEEN: Normal. ADRENALS/URINARY TRACT: Kidneys are orthotopic, demonstrating symmetric enhancement. No nephrolithiasis, hydronephrosis or solid renal masses. Urinary bladder is partially distended and unremarkable. Normal adrenal glands. STOMACH/BOWEL: Nondistended stomach with normal small bowel rotation. Mild diffuse distal ileal thickening in the right lower quadrant compatible with changes of ileitis and underlying patient history of Crohn's disease. No pseudo sacculation 0 bowel obstructions. No fistulous connection. Moderate stool burden within nondistended colon. Submucosal fat deposition with mild-to-moderate luminal narrowing of the rectosigmoid compatible which changes of chronic inflammatory bowel disease. Normal appendix. VASCULAR/LYMPHATIC: No aortic aneurysm. Patent branch vessels. No lymphadenopathy. REPRODUCTIVE: Normal size prostate. OTHER: No intraperitoneal free fluid or free air. MUSCULOSKELETAL: Sacroiliac joints are unremarkable. Slight disc space narrowing L4-5 and L5-S1. No acute nor suspicious osseous lesions. IMPRESSION: 1. Mild luminal narrowing and submucosal fat deposition along the rectosigmoid compatible with changes of chronic inflammatory bowel disease. More active inflammatory change noted of the distal and terminal ileum with moderate transmural thickening noted. 2. No bowel obstruction or free air. 3.  No acute solid organ pathology. 4. Mild disc space narrowing L4-5 and L5-S1. Electronically Signed   By: Ashley Royalty M.D.   On: 06/18/2017 20:17        Scheduled Meds: . amLODipine  10 mg Oral Daily  . feeding supplement (ENSURE ENLIVE)  237 mL Oral BID BM  . mesalamine  0.375 g Oral Daily  . methylPREDNISolone (SOLU-MEDROL) injection  40 mg Intravenous Q12H  . tuberculin  5 Units Intradermal Once  . zinc sulfate  220 mg Oral Daily   Continuous Infusions: . sodium chloride 75 mL/hr at 06/20/17 1640  . ciprofloxacin Stopped (06/20/17 1030)  .  metronidazole Stopped (06/20/17 1640)     LOS: 2 days    Time spent: 25 minutes. Greater than 50% of this time was spent in direct contact with the patient coordinating care.     Lelon Frohlich, MD [facility] Pager [phone]  If 7PM-7AM, please contact night-coverage [email or web address] Password [email or web address] 06/20/2017, 5:37 PM

## 2017-06-21 LAB — BASIC METABOLIC PANEL
ANION GAP: 9 (ref 5–15)
BUN: 15 mg/dL (ref 6–20)
CHLORIDE: 102 mmol/L (ref 101–111)
CO2: 25 mmol/L (ref 22–32)
Calcium: 8.3 mg/dL — ABNORMAL LOW (ref 8.9–10.3)
Creatinine, Ser: 0.73 mg/dL (ref 0.61–1.24)
GFR calc Af Amer: >60 mL/min (ref >60)
GLUCOSE: 119 mg/dL — AB (ref 65–99)
POTASSIUM: 4 mmol/L (ref 3.5–5.1)
Sodium: 136 mmol/L (ref 135–145)

## 2017-06-21 LAB — GASTROINTESTINAL PANEL BY PCR, STOOL (REPLACES STOOL CULTURE)
ASTROVIRUS: NOT DETECTED
Adenovirus F40/41: NOT DETECTED
CAMPYLOBACTER SPECIES: NOT DETECTED
CRYPTOSPORIDIUM: NOT DETECTED
CYCLOSPORA CAYETANENSIS: NOT DETECTED
ENTAMOEBA HISTOLYTICA: NOT DETECTED
ENTEROTOXIGENIC E COLI (ETEC): NOT DETECTED
Enteroaggregative E coli (EAEC): NOT DETECTED
Enteropathogenic E coli (EPEC): NOT DETECTED
Giardia lamblia: NOT DETECTED
Norovirus GI/GII: NOT DETECTED
PLESIMONAS SHIGELLOIDES: NOT DETECTED
Rotavirus A: NOT DETECTED
SALMONELLA SPECIES: NOT DETECTED
SHIGA LIKE TOXIN PRODUCING E COLI (STEC): NOT DETECTED
Sapovirus (I, II, IV, and V): NOT DETECTED
Shigella/Enteroinvasive E coli (EIEC): NOT DETECTED
VIBRIO CHOLERAE: NOT DETECTED
VIBRIO SPECIES: NOT DETECTED
Yersinia enterocolitica: NOT DETECTED

## 2017-06-21 LAB — CBC
HEMATOCRIT: 32.1 % — AB (ref 39.0–52.0)
HEMOGLOBIN: 9.7 g/dL — AB (ref 13.0–17.0)
MCH: 22.4 pg — ABNORMAL LOW (ref 26.0–34.0)
MCHC: 30.2 g/dL (ref 30.0–36.0)
MCV: 74 fL — AB (ref 78.0–100.0)
Platelets: 460 10*3/uL — ABNORMAL HIGH (ref 150–400)
RBC: 4.34 MIL/uL (ref 4.22–5.81)
RDW: 16.4 % — AB (ref 11.5–15.5)
WBC: 12.2 10*3/uL — ABNORMAL HIGH (ref 4.0–10.5)

## 2017-06-21 LAB — C DIFFICILE QUICK SCREEN W PCR REFLEX
C DIFFICILE (CDIFF) INTERP: NOT DETECTED
C Diff antigen: NEGATIVE
C Diff toxin: NEGATIVE

## 2017-06-21 MED ORDER — LOPERAMIDE HCL 2 MG PO CAPS
2.0000 mg | ORAL_CAPSULE | Freq: Every morning | ORAL | Status: DC
  Administered 2017-06-22 – 2017-06-23 (×2): 2 mg via ORAL
  Filled 2017-06-21 (×2): qty 1

## 2017-06-21 MED ORDER — LOPERAMIDE HCL 2 MG PO CAPS
2.0000 mg | ORAL_CAPSULE | Freq: Once | ORAL | Status: AC
  Administered 2017-06-21: 2 mg via ORAL
  Filled 2017-06-21: qty 1

## 2017-06-21 MED ORDER — HYDROCORTISONE ACETATE 25 MG RE SUPP
25.0000 mg | Freq: Every day | RECTAL | Status: DC
  Administered 2017-06-21 – 2017-06-22 (×2): 25 mg via RECTAL
  Filled 2017-06-21 (×5): qty 1

## 2017-06-21 NOTE — Progress Notes (Signed)
PROGRESS NOTE    Dwayne Cooper  YWV:[id number] DOB: 15-Jun-1975 DOA: 06/18/2017 PCP: [facility]     Brief Narrative:  42 year old man admitted from home on 2/18 with rectal bleeding and diarrhea.  He has a history of Crohn's disease but has been unfortunately managed suboptimally given lack of health insurance and medical follow-up.  GI is on board.   Assessment & Plan:   Active Problems:   Essential hypertension, benign   Crohn's colitis ([place])   Crohn disease ([place])   Crohn's colitis, with rectal bleeding ([place])   Iron deficiency anemia due to chronic blood loss   Protein-calorie malnutrition, severe   Crohn's disease with acute flareup -Continue IV Solu-Medrol as dosed by GI. -C. difficile is negative, stool pathogen panel is pending. -Continue Cipro and Flagyl p.o., can discontinue if pathogen panel is negative. -GI is working on approval for Humira. -Still with crampy abdominal pain and bloody diarrhea.  He states 6-7 episodes over past 24 hours. -Anusol ordered.  Iron deficiency anemia due to chronic blood loss -Hemoglobin stable at 9.7. -Ferraheme given on 2/21.   DVT prophylaxis: SCDs Code Status: Full code Family Communication: Patient only Disposition Plan: Pending final GI recommendations and symptomatic improvement  Consultants:   GI  Procedures:   None  Antimicrobials:  Anti-infectives (From admission, onward)   Start     Dose/Rate Route Frequency Ordered Stop   06/20/17 2200  metroNIDAZOLE (FLAGYL) tablet 500 mg     500 mg Oral Every 8 hours 06/20/17 1748     06/20/17 2000  ciprofloxacin (CIPRO) tablet 500 mg     500 mg Oral 2 times daily 06/20/17 1748     06/18/17 2300  metroNIDAZOLE (FLAGYL) IVPB 500 mg  Status:  Discontinued     500 mg 100 mL/hr over 60 Minutes Intravenous Every 8 hours 06/18/17 2137 06/20/17 1741   06/18/17 2200  ciprofloxacin (CIPRO) IVPB 400 mg  Status:  Discontinued     400 mg 200 mL/hr over 60  Minutes Intravenous Every 12 hours 06/18/17 2137 06/20/17 1741       Subjective: Had 6-7 episodes of what he describes as watery bowel movements with some blood mixed in.  Still with some mild crampy abdominal pain.  Objective: Vitals:   06/20/17 1320 06/20/17 2100 06/21/17 0541 06/21/17 1300  BP: 114/61 137/80 (!) 147/97 (!) 144/85  Pulse: 99 (!) 105 97 100  Resp: 18 20 15 16   Temp: 97.8 F (36.6 C) 98 F (36.7 C) (!) 97.5 F (36.4 C) 97.9 F (36.6 C)  TempSrc: Oral Oral Oral Oral  SpO2: 98% 97% 95% 97%  Weight:      Height:        Intake/Output Summary (Last 24 hours) at 06/21/2017 1617 Last data filed at 06/21/2017 1300 Gross per 24 hour  Intake 3078.33 ml  Output 900 ml  Net 2178.33 ml   Filed Weights   06/18/17 1345 06/19/17 0840  Weight: 108.9 kg (240 lb) 106.9 kg (235 lb 9.6 oz)    Examination:  General exam: Alert, awake, oriented x 3 Respiratory system: Clear to auscultation. Respiratory effort normal. Cardiovascular system:RRR. No murmurs, rubs, gallops. Gastrointestinal system: Abdomen is nondistended, soft and nontender. No organomegaly or masses felt. Normal bowel sounds heard. Central nervous system: Alert and oriented. No focal neurological deficits. Extremities: No C/C/E, +pedal pulses Skin: No rashes, lesions or ulcers Psychiatry: Judgement and insight appear normal. Mood & affect appropriate.      Data Reviewed: I  have personally reviewed following labs and imaging studies  CBC: Recent Labs  Lab 06/18/17 1423 06/19/17 0606 06/20/17 0539 06/21/17 0621  WBC 14.3* 9.9 14.5* 12.2*  NEUTROABS 12.8  --   --   --   HGB 11.9* 10.0* 9.6* 9.7*  HCT 40.1 33.5* 32.0* 32.1*  MCV 74.8* 74.6* 74.9* 74.0*  PLT 639* 517* 534* 161*   Basic Metabolic Panel: Recent Labs  Lab 06/18/17 1423 06/19/17 0606 06/20/17 0539 06/21/17 0621  NA 139 136 137 136  K 4.3 4.4 3.9 4.0  CL 100* 102 103 102  CO2 25 25 24 25   GLUCOSE 137* 144* 126* 119*  BUN 18  15 15 15   CREATININE 1.09 0.77 0.80 0.73  CALCIUM 8.8* 8.5* 8.3* 8.3*   GFR: Estimated Creatinine Clearance: 159.6 mL/min (by C-G formula based on SCr of 0.73 mg/dL). Liver Function Tests: Recent Labs  Lab 06/19/17 0606  AST 12*  ALT 15*  ALKPHOS 53  BILITOT 0.5  PROT 5.7*  ALBUMIN 2.5*   No results for input(s): LIPASE, AMYLASE in the last 168 hours. No results for input(s): AMMONIA in the last 168 hours. Coagulation Profile: No results for input(s): INR, PROTIME in the last 168 hours. Cardiac Enzymes: No results for input(s): CKTOTAL, CKMB, CKMBINDEX, TROPONINI in the last 168 hours. BNP (last 3 results) No results for input(s): PROBNP in the last 8760 hours. HbA1C: No results for input(s): HGBA1C in the last 72 hours. CBG: Recent Labs  Lab 06/19/17 0041  GLUCAP 139*   Lipid Profile: No results for input(s): CHOL, HDL, LDLCALC, TRIG, CHOLHDL, LDLDIRECT in the last 72 hours. Thyroid Function Tests: No results for input(s): TSH, T4TOTAL, FREET4, T3FREE, THYROIDAB in the last 72 hours. Anemia Panel: Recent Labs    06/19/17 0606  VITAMINB12 232  FOLATE 20.6  FERRITIN 14*  TIBC 266  IRON 15*  RETICCTPCT 2.9   Urine analysis:    Component Value Date/Time   COLORURINE YELLOW 06/18/2017 [place] 06/18/2017 1715   LABSPEC >1.046 (H) 06/18/2017 1715   PHURINE 6.0 06/18/2017 1715   GLUCOSEU NEGATIVE 06/18/2017 1715   HGBUR NEGATIVE 06/18/2017 [place] 06/18/2017 1715   KETONESUR NEGATIVE 06/18/2017 1715   PROTEINUR NEGATIVE 06/18/2017 1715   NITRITE NEGATIVE 06/18/2017 1715   LEUKOCYTESUR NEGATIVE 06/18/2017 1715   Sepsis Labs: @LABRCNTIP (procalcitonin:4,lacticidven:4)  ) Recent Results (from the past 240 hour(s))  C difficile quick scan w PCR reflex     Status: None   Collection Time: 06/20/17  5:45 PM  Result Value Ref Range Status   C Diff antigen NEGATIVE NEGATIVE Final   C Diff toxin NEGATIVE NEGATIVE Final   C  Diff interpretation No C. difficile detected.  Final    Comment: Performed at [facility], [address], [place], [place] [address]         Radiology Studies: No results found.      Scheduled Meds: . amLODipine  10 mg Oral Daily  . ciprofloxacin  500 mg Oral BID  . feeding supplement (ENSURE ENLIVE)  237 mL Oral BID BM  . hydrocortisone  25 mg Rectal QHS  . [START ON 06/22/2017] loperamide  2 mg Oral q morning - 10a  . mesalamine  0.375 g Oral Daily  . methylPREDNISolone (SOLU-MEDROL) injection  40 mg Intravenous Q12H  . metroNIDAZOLE  500 mg Oral Q8H  . zinc sulfate  220 mg Oral Daily   Continuous Infusions: . sodium chloride 75 mL/hr at 06/21/17  2947     LOS: 3 days    Time spent: 25 minutes. Greater than 50% of this time was spent in direct contact with the patient coordinating care.     Lelon Frohlich, MD [facility] Pager [phone]  If 7PM-7AM, please contact night-coverage [email or web address] Password [facility] 06/21/2017, 4:17 PM

## 2017-06-21 NOTE — Progress Notes (Addendum)
Patient ID: Dwayne Cooper, male   DOB: 1976/04/20, 42 y.o.   MRN: [id number] Feels better. Had 6 loose stools yesterday. Did see some blood. He is tolerating his diet. He has no complaints. Will advance diet to  Low residue.    PPD at 48 hours is negative. Loperamide 2 mg p.o. daily in order to slow down bowel frequency. Anusol HC suppository per rectum nightly. Request for Humira has been submitted to the manufacture by my office staff.

## 2017-06-21 NOTE — Care Management Note (Signed)
Case Management Note  Patient Details  Name: Dwayne Cooper MRN: [id number] Date of Birth: 1975/11/30  Subjective/Objective:  Adm with Chron's colitis. Ind with ADL's. No insurance.  CM consulted for medication need. Humira request from manufacturer being submitted by GI office. Financial counselor will talk with patient also.             Action/Plan: DC home with self care.   Expected Discharge Date:  06/21/17               Expected Discharge Plan:  Home/Self Care  In-House Referral:     Discharge planning Services  CM Consult, Medication Assistance  Post Acute Care Choice:    Choice offered to:     DME Arranged:    DME Agency:     HH Arranged:    HH Agency:     Status of Service:  In process, will continue to follow  If discussed at Long Length of Stay Meetings, dates discussed:    Additional Comments:  Harutyun Monteverde, Chauncey Reading, RN 06/21/2017, 3:34 PM

## 2017-06-22 NOTE — Progress Notes (Signed)
  Subjective:  Patient feels better.  He is having less lower abdominal pain.  He is also having less rectal burning with his bowel movement.  He only had one stool today.  Stool is loose.  He is still seeing blood but not as much.  Appetite is normal.  Objective: Blood pressure (!) 149/83, pulse 87, temperature 97.7 F (36.5 C), temperature source Oral, resp. rate 18, height 6' 4"  (1.93 m), weight 235 lb 9.6 oz (106.9 kg), SpO2 97 %. Patient is alert and in no acute distress. Abdomen is soft with mild tenderness across lower abdomen without organomegaly or masses. No peripheral edema or clubbing noted.  Labs/studies Results:  Recent Labs    07/14/17 0539 06/21/17 0621  WBC 14.5* 12.2*  HGB 9.6* 9.7*  HCT 32.0* 32.1*  PLT 534* 460*    BMET  Recent Labs    July 14, 2017 0539 06/21/17 0621  NA 137 136  K 3.9 4.0  CL 103 102  CO2 24 25  GLUCOSE 126* 119*  BUN 15 15  CREATININE 0.80 0.73  CALCIUM 8.3* 8.3*    PPD is negative.  GI pathogen panel is negative. C difficile antigen and toxin negative.  Assessment:  #1.  Ileocolonic Crohn's disease.  Stool studies are negative.  He is doing better with antibiotics and IV steroids.  PPD is negative at 72 hours.  Please note request for Humira has been submitted to the manufacture on his behalf.  #2.  Iron deficiency anemia secondary to chronic GI blood loss.  Patient received Feraheme 2 days ago.  Recommendations:  CBC in a.m. He should be able to go home in a.m. Can be switched to prednisone at 30 mg p.o. every morning. Continue p.o. antibiotics for another 1 week. Continue loperamide 2 mg once or twice daily. He is on [facility] suppository 1 daily another 2 weeks. Continue oral mesalamine at current dose. Plan to see him in the office within 2-3 weeks.

## 2017-06-22 NOTE — Progress Notes (Signed)
PROGRESS NOTE    Dwayne Cooper  YQM:[id number] DOB: February 16, 1976 DOA: 06/18/2017 PCP: [facility] [place]     Brief Narrative:  42 year old man admitted from home on 2/18 with rectal bleeding and diarrhea.  He has a history of Crohn's disease but has been unfortunately managed suboptimally given lack of health insurance and medical follow-up.  GI is on board.   Assessment & Plan:   Active Problems:   Essential hypertension, benign   Crohn's colitis ([place])   Crohn disease ([place])   Crohn's colitis, with rectal bleeding ([place])   Iron deficiency anemia due to chronic blood loss   Protein-calorie malnutrition, severe   Crohn's disease with acute flareup -Improved, has only had one bowel movement all day and 2 overnight, less crampy abdominal pain MS rectal burning with bowel movements. -Discussed case with GI, plan to transition to prednisone taper on discharge, they are still working on getting him approved for Humira. -Anticipate discharge however next 24 hours. -We will discontinue Cipro and Flagyl as his stool pathogen panel has resulted negative.  Iron deficiency anemia due to chronic blood loss -Hemoglobin stable at 9.7. -Ferraheme given on 2/21.   DVT prophylaxis: SCDs Code Status: Full code Family Communication: Patient only Disposition Plan: Anticipate discharge home over next 24 hours  Consultants:   GI  Procedures:   None  Antimicrobials:  Anti-infectives (From admission, onward)   Start     Dose/Rate Route Frequency Ordered Stop   06/20/17 2200  metroNIDAZOLE (FLAGYL) tablet 500 mg  Status:  Discontinued     500 mg Oral Every 8 hours 06/20/17 1748 06/22/17 1005   06/20/17 2000  ciprofloxacin (CIPRO) tablet 500 mg  Status:  Discontinued     500 mg Oral 2 times daily 06/20/17 1748 06/22/17 1005   06/18/17 2300  metroNIDAZOLE (FLAGYL) IVPB 500 mg  Status:  Discontinued     500 mg 100 mL/hr over 60 Minutes Intravenous Every 8 hours 06/18/17 2137  06/20/17 1741   06/18/17 2200  ciprofloxacin (CIPRO) IVPB 400 mg  Status:  Discontinued     400 mg 200 mL/hr over 60 Minutes Intravenous Every 12 hours 06/18/17 2137 06/20/17 1741       Subjective: Feels improved, less cramping abdominal pain and rectal burning with bowel movements, 3 stools since last night total.  Objective: Vitals:   06/21/17 2000 06/21/17 2109 06/22/17 0528 06/22/17 1441  BP: (!) 146/90  123/61 (!) 149/83  Pulse: 87  85 87  Resp: 20  15 18   Temp: 98.1 F (36.7 C)  97.7 F (36.5 C) 97.7 F (36.5 C)  TempSrc: Oral  Oral Oral  SpO2: 96% 94% 97% 97%  Weight:      Height:        Intake/Output Summary (Last 24 hours) at 06/22/2017 1722 Last data filed at 06/22/2017 1300 Gross per 24 hour  Intake 2180 ml  Output 850 ml  Net 1330 ml   Filed Weights   06/18/17 1345 06/19/17 0840  Weight: 108.9 kg (240 lb) 106.9 kg (235 lb 9.6 oz)    Examination:  General exam: Alert, awake, oriented x 3 Respiratory system: Clear to auscultation. Respiratory effort normal. Cardiovascular system:RRR. No murmurs, rubs, gallops. Gastrointestinal system: Abdomen is nondistended, soft and nontender. No organomegaly or masses felt. Normal bowel sounds heard. Central nervous system: Alert and oriented. No focal neurological deficits. Extremities: No C/C/E, +pedal pulses Skin: No rashes, lesions or ulcers Psychiatry: Judgement and insight appear normal. Mood & affect appropriate.  Data Reviewed: I have personally reviewed following labs and imaging studies  CBC: Recent Labs  Lab 06/18/17 1423 06/19/17 0606 06/20/17 0539 06/21/17 0621  WBC 14.3* 9.9 14.5* 12.2*  NEUTROABS 12.8  --   --   --   HGB 11.9* 10.0* 9.6* 9.7*  HCT 40.1 33.5* 32.0* 32.1*  MCV 74.8* 74.6* 74.9* 74.0*  PLT 639* 517* 534* 573*   Basic Metabolic Panel: Recent Labs  Lab 06/18/17 1423 06/19/17 0606 06/20/17 0539 06/21/17 0621  NA 139 136 137 136  K 4.3 4.4 3.9 4.0  CL 100* 102 103  102  CO2 25 25 24 25   GLUCOSE 137* 144* 126* 119*  BUN 18 15 15 15   CREATININE 1.09 0.77 0.80 0.73  CALCIUM 8.8* 8.5* 8.3* 8.3*   GFR: Estimated Creatinine Clearance: 159.6 mL/min (by C-G formula based on SCr of 0.73 mg/dL). Liver Function Tests: Recent Labs  Lab 06/19/17 0606  AST 12*  ALT 15*  ALKPHOS 53  BILITOT 0.5  PROT 5.7*  ALBUMIN 2.5*   No results for input(s): LIPASE, AMYLASE in the last 168 hours. No results for input(s): AMMONIA in the last 168 hours. Coagulation Profile: No results for input(s): INR, PROTIME in the last 168 hours. Cardiac Enzymes: No results for input(s): CKTOTAL, CKMB, CKMBINDEX, TROPONINI in the last 168 hours. BNP (last 3 results) No results for input(s): PROBNP in the last 8760 hours. HbA1C: No results for input(s): HGBA1C in the last 72 hours. CBG: Recent Labs  Lab 06/19/17 0041  GLUCAP 139*   Lipid Profile: No results for input(s): CHOL, HDL, LDLCALC, TRIG, CHOLHDL, LDLDIRECT in the last 72 hours. Thyroid Function Tests: No results for input(s): TSH, T4TOTAL, FREET4, T3FREE, THYROIDAB in the last 72 hours. Anemia Panel: No results for input(s): VITAMINB12, FOLATE, FERRITIN, TIBC, IRON, RETICCTPCT in the last 72 hours. Urine analysis:    Component Value Date/Time   COLORURINE YELLOW 06/18/2017 [place] 06/18/2017 1715   LABSPEC >1.046 (H) 06/18/2017 1715   PHURINE 6.0 06/18/2017 1715   GLUCOSEU NEGATIVE 06/18/2017 1715   HGBUR NEGATIVE 06/18/2017 [place] 06/18/2017 1715   KETONESUR NEGATIVE 06/18/2017 1715   PROTEINUR NEGATIVE 06/18/2017 1715   NITRITE NEGATIVE 06/18/2017 1715   LEUKOCYTESUR NEGATIVE 06/18/2017 1715   Sepsis Labs: @LABRCNTIP (procalcitonin:4,lacticidven:4)  ) Recent Results (from the past 240 hour(s))  Gastrointestinal Panel by PCR , Stool     Status: None   Collection Time: 06/20/17  5:45 PM  Result Value Ref Range Status   Campylobacter species NOT DETECTED NOT  DETECTED Final   Plesimonas shigelloides NOT DETECTED NOT DETECTED Final   Salmonella species NOT DETECTED NOT DETECTED Final   Yersinia enterocolitica NOT DETECTED NOT DETECTED Final   Vibrio species NOT DETECTED NOT DETECTED Final   Vibrio cholerae NOT DETECTED NOT DETECTED Final   Enteroaggregative E coli (EAEC) NOT DETECTED NOT DETECTED Final   Enteropathogenic E coli (EPEC) NOT DETECTED NOT DETECTED Final   Enterotoxigenic E coli (ETEC) NOT DETECTED NOT DETECTED Final   Shiga like toxin producing E coli (STEC) NOT DETECTED NOT DETECTED Final   Shigella/Enteroinvasive E coli (EIEC) NOT DETECTED NOT DETECTED Final   Cryptosporidium NOT DETECTED NOT DETECTED Final   Cyclospora cayetanensis NOT DETECTED NOT DETECTED Final   Entamoeba histolytica NOT DETECTED NOT DETECTED Final   Giardia lamblia NOT DETECTED NOT DETECTED Final   Adenovirus F40/41 NOT DETECTED NOT DETECTED Final   Astrovirus NOT DETECTED NOT DETECTED Final   Norovirus  GI/GII NOT DETECTED NOT DETECTED Final   Rotavirus A NOT DETECTED NOT DETECTED Final   Sapovirus (I, II, IV, and V) NOT DETECTED NOT DETECTED Final    Comment: Performed at [facility], [address] [place], [place] [address]  C difficile quick scan w PCR reflex     Status: None   Collection Time: 06/20/17  5:45 PM  Result Value Ref Range Status   C Diff antigen NEGATIVE NEGATIVE Final   C Diff toxin NEGATIVE NEGATIVE Final   C Diff interpretation No C. difficile detected.  Final    Comment: Performed at [facility], [address] [place], [place] [address]         Radiology Studies: No results found.      Scheduled Meds: . amLODipine  10 mg Oral Daily  . feeding supplement (ENSURE ENLIVE)  237 mL Oral BID BM  . hydrocortisone  25 mg Rectal QHS  . loperamide  2 mg Oral q morning - 10a  . mesalamine  0.375 g Oral Daily  . methylPREDNISolone (SOLU-MEDROL) injection  40 mg Intravenous Q12H  . zinc sulfate  220 mg Oral Daily    Continuous Infusions: . sodium chloride 75 mL/hr at 06/21/17 0620     LOS: 4 days    Time spent: 25 minutes. Greater than 50% of this time was spent in direct contact with the patient coordinating care.     Lelon Frohlich, MD [facility] Pager [phone]  If 7PM-7AM, please contact night-coverage [email or web address] Password [facility] 06/22/2017, 5:22 PM

## 2017-06-22 NOTE — Care Management (Addendum)
Provided patient with a [facility] voucher in case he discharges over the weekend and needs medication assistance.  Explained voucher to patient. [facility] [place]. [facility] [place] [facility] program. He plans to ask MD for paper prescriptions so that he can get them filled at a participating pharmacy.

## 2017-06-23 DIAGNOSIS — K50111 Crohn's disease of large intestine with rectal bleeding: Principal | ICD-10-CM

## 2017-06-23 MED ORDER — LOPERAMIDE HCL 2 MG PO CAPS: 2.0000 mg | ORAL_CAPSULE | Freq: Every morning | ORAL | 2 refills | Status: DC

## 2017-06-23 MED ORDER — PREDNISONE 5 MG PO TABS: 5.0000 mg | ORAL_TABLET | Freq: Every day | ORAL | 0 refills | Status: DC

## 2017-06-23 MED ORDER — HYDROCORTISONE ACETATE 25 MG RE SUPP: 25.0000 mg | Freq: Every day | RECTAL | 2 refills | Status: DC

## 2017-06-23 NOTE — Progress Notes (Signed)
IV discontinued,catheter intact. Discharge instructions given on medications and follow up visits,patient verbalized understanding. Prescriptions sent with patient. Accompanied by staff to an awaiting vehicle.

## 2017-06-23 NOTE — Discharge Summary (Signed)
Physician Discharge Summary  Dwayne Cooper LKT:[id number] DOB: Mar 16, 1976 DOA: 06/18/2017  PCP: Sandria Manly [place] date: 3/42/8768 Discharge date: 06/23/2017  Time spent: 45 minutes  Recommendations for Outpatient Follow-up:  -To be discharged home today. -Advised to follow-up with Dr. Laural Golden in 2 weeks.  Discharge Diagnoses:  Active Problems:   Essential hypertension, benign   Crohn's colitis ([place])   Crohn disease ([place])   Crohn's colitis, with rectal bleeding ([place])   Iron deficiency anemia due to chronic blood loss   Protein-calorie malnutrition, severe   Discharge Condition: Stable and improved  Filed Weights   06/18/17 1345 06/19/17 0840  Weight: 108.9 kg (240 lb) 106.9 kg (235 lb 9.6 oz)    History of present illness:  As per Dr. Wynelle Cleveland on 2/18: Dwayne Cooper is a 42 y.o. male with medical history of Crohn's disease presents with bloody diarrhea and abdominal pain. He has been having this for about 3 wks now and has been on a Prednisone taper (currently at 40 mg), Cipro and Flagyl but symptoms have not resolved. He is having 8-10 stools per day. Pain is present across his lower abdomen. He has been vomiting and not able to tolerate solid food. He last vomited this afternoon.   ED Course: CT scan abd/pelvis "active inflammatory change noted of the distal and terminal ileum with moderate transmural thickening noted."    Hospital Course:   Crohn's disease with acute flareup -Improved, has only had 2 bowel movements in the past 24 hours, less crampy abdominal pain and rectal burning with bowel movements. -Discussed case with GI, plan to transition to prednisone taper on discharge, they are still working on getting him approved for Humira. -Cipro and Flagyl were discontinued as his stool pathogen panel has resulted negative.  Iron deficiency anemia due to chronic blood loss -Hemoglobin stable at 9.7. -Ferraheme given on  2/21.    Procedures:  None   Consultations:  GI, Dr. Laural Golden  Discharge Instructions  Discharge Instructions    Diet - low sodium heart healthy   Complete by:  As directed    Increase activity slowly   Complete by:  As directed      Allergies as of 06/23/2017   No Known Allergies     Medication List    STOP taking these medications   ciprofloxacin 500 MG tablet Commonly known as:  CIPRO   metroNIDAZOLE 500 MG tablet Commonly known as:  FLAGYL     TAKE these medications   amLODipine 5 MG tablet Commonly known as:  NORVASC Take 1 tablet (5 mg total) by mouth daily. What changed:  how much to take   hydrocortisone 25 MG suppository Commonly known as:  ANUSOL-HC Place 1 suppository (25 mg total) rectally at bedtime.   loperamide 2 MG capsule Commonly known as:  IMODIUM Take 1 capsule (2 mg total) by mouth every morning. Start taking on:  06/24/2017   mesalamine 0.375 g 24 hr capsule Commonly known as:  APRISO Take 1 capsule (0.375 g total) by mouth daily. 4 tabs daily.   multivitamin with minerals Tabs tablet Take by mouth daily. 2 gummies   predniSONE 5 MG tablet Commonly known as:  DELTASONE Take 1 tablet (5 mg total) by mouth daily with breakfast. Take 6 tablets daily for 1 week, 5 tabs for 1 week, 4 tabs for 1 week, 3 tabs for 1 week, 2 tabs for 1 week, 1 tab for 1 week What changed:  additional instructions  No Known Allergies Follow-up Information    Rehman, Mechele Dawley, MD. Schedule an appointment as soon as possible for a visit in 2 week(s).   Specialty:  Gastroenterology Contact information: [place], [address] [place] [address] [phone]            The results of significant diagnostics from this hospitalization (including imaging, microbiology, ancillary and laboratory) are listed below for reference.    Significant Diagnostic Studies: Ct Abdomen Pelvis W Contrast  Result Date: 06/18/2017 CLINICAL DATA:  Crohn's  disease with rectal bleeding x1 year. Patient passed out last week. EXAM: CT ABDOMEN AND PELVIS WITH CONTRAST TECHNIQUE: Multidetector CT imaging of the abdomen and pelvis was performed using the standard protocol following bolus administration of intravenous contrast. CONTRAST:  139m ISOVUE-300 IOPAMIDOL (ISOVUE-300) INJECTION 61% COMPARISON:  11/23/2016 FINDINGS: LOWER CHEST: Lung bases are clear. Included heart size is normal. Small hiatal hernia. No pericardial effusion. HEPATOBILIARY: Liver and gallbladder are normal. PANCREAS: Normal. SPLEEN: Normal. ADRENALS/URINARY TRACT: Kidneys are orthotopic, demonstrating symmetric enhancement. No nephrolithiasis, hydronephrosis or solid renal masses. Urinary bladder is partially distended and unremarkable. Normal adrenal glands. STOMACH/BOWEL: Nondistended stomach with normal small bowel rotation. Mild diffuse distal ileal thickening in the right lower quadrant compatible with changes of ileitis and underlying patient history of Crohn's disease. No pseudo sacculation 0 bowel obstructions. No fistulous connection. Moderate stool burden within nondistended colon. Submucosal fat deposition with mild-to-moderate luminal narrowing of the rectosigmoid compatible which changes of chronic inflammatory bowel disease. Normal appendix. VASCULAR/LYMPHATIC: No aortic aneurysm. Patent branch vessels. No lymphadenopathy. REPRODUCTIVE: Normal size prostate. OTHER: No intraperitoneal free fluid or free air. MUSCULOSKELETAL: Sacroiliac joints are unremarkable. Slight disc space narrowing L4-5 and L5-S1. No acute nor suspicious osseous lesions. IMPRESSION: 1. Mild luminal narrowing and submucosal fat deposition along the rectosigmoid compatible with changes of chronic inflammatory bowel disease. More active inflammatory change noted of the distal and terminal ileum with moderate transmural thickening noted. 2. No bowel obstruction or free air. 3. No acute solid organ pathology. 4. Mild  disc space narrowing L4-5 and L5-S1. Electronically Signed   By: DAshley RoyaltyM.D.   On: 06/18/2017 20:17    Microbiology: Recent Results (from the past 240 hour(s))  Gastrointestinal Panel by PCR , Stool     Status: None   Collection Time: 06/20/17  5:45 PM  Result Value Ref Range Status   Campylobacter species NOT DETECTED NOT DETECTED Final   Plesimonas shigelloides NOT DETECTED NOT DETECTED Final   Salmonella species NOT DETECTED NOT DETECTED Final   Yersinia enterocolitica NOT DETECTED NOT DETECTED Final   Vibrio species NOT DETECTED NOT DETECTED Final   Vibrio cholerae NOT DETECTED NOT DETECTED Final   Enteroaggregative E coli (EAEC) NOT DETECTED NOT DETECTED Final   Enteropathogenic E coli (EPEC) NOT DETECTED NOT DETECTED Final   Enterotoxigenic E coli (ETEC) NOT DETECTED NOT DETECTED Final   Shiga like toxin producing E coli (STEC) NOT DETECTED NOT DETECTED Final   Shigella/Enteroinvasive E coli (EIEC) NOT DETECTED NOT DETECTED Final   Cryptosporidium NOT DETECTED NOT DETECTED Final   Cyclospora cayetanensis NOT DETECTED NOT DETECTED Final   Entamoeba histolytica NOT DETECTED NOT DETECTED Final   Giardia lamblia NOT DETECTED NOT DETECTED Final   Adenovirus F40/41 NOT DETECTED NOT DETECTED Final   Astrovirus NOT DETECTED NOT DETECTED Final   Norovirus GI/GII NOT DETECTED NOT DETECTED Final   Rotavirus A NOT DETECTED NOT DETECTED Final   Sapovirus (I, II, IV, and V) NOT  DETECTED NOT DETECTED Final    Comment: Performed at [facility], [address] [place], [place] [address]  C difficile quick scan w PCR reflex     Status: None   Collection Time: 06/20/17  5:45 PM  Result Value Ref Range Status   C Diff antigen NEGATIVE NEGATIVE Final   C Diff toxin NEGATIVE NEGATIVE Final   C Diff interpretation No C. difficile detected.  Final    Comment: Performed at [facility], [address], [place], [place] [address]     Labs: Basic Metabolic Panel: Recent Labs  Lab  06/18/17 1423 06/19/17 0606 06/20/17 0539 06/21/17 0621  NA 139 136 137 136  K 4.3 4.4 3.9 4.0  CL 100* 102 103 102  CO2 25 25 24 25   GLUCOSE 137* 144* 126* 119*  BUN 18 15 15 15   CREATININE 1.09 0.77 0.80 0.73  CALCIUM 8.8* 8.5* 8.3* 8.3*   Liver Function Tests: Recent Labs  Lab 06/19/17 0606  AST 12*  ALT 15*  ALKPHOS 53  BILITOT 0.5  PROT 5.7*  ALBUMIN 2.5*   No results for input(s): LIPASE, AMYLASE in the last 168 hours. No results for input(s): AMMONIA in the last 168 hours. CBC: Recent Labs  Lab 06/18/17 1423 06/19/17 0606 06/20/17 0539 06/21/17 0621  WBC 14.3* 9.9 14.5* 12.2*  NEUTROABS 12.8  --   --   --   HGB 11.9* 10.0* 9.6* 9.7*  HCT 40.1 33.5* 32.0* 32.1*  MCV 74.8* 74.6* 74.9* 74.0*  PLT 639* 517* 534* 460*   Cardiac Enzymes: No results for input(s): CKTOTAL, CKMB, CKMBINDEX, TROPONINI in the last 168 hours. BNP: BNP (last 3 results) No results for input(s): BNP in the last 8760 hours.  ProBNP (last 3 results) No results for input(s): PROBNP in the last 8760 hours.  CBG: Recent Labs  Lab 06/19/17 0041  GLUCAP 139*       Signed:  [facility] Pager: [phone] 06/23/2017, 5:54 PM

## 2017-06-27 ENCOUNTER — Telehealth (INDEPENDENT_AMBULATORY_CARE_PROVIDER_SITE_OTHER): Payer: Self-pay | Admitting: *Deleted

## 2017-06-27 NOTE — Telephone Encounter (Signed)
Imodium in the am.

## 2017-06-27 NOTE — Telephone Encounter (Signed)
Patient called to let Terri and Dr Laural Golden know he is still having diarrhea with no blood everyday, patient wants to know what he should do for the time being until he can start his new medicine. Please advise [phone]

## 2017-06-27 NOTE — Telephone Encounter (Signed)
I called patient and made him aware. Patient states Terri called this morning

## 2017-07-09 ENCOUNTER — Telehealth (INDEPENDENT_AMBULATORY_CARE_PROVIDER_SITE_OTHER): Payer: Self-pay | Admitting: Internal Medicine

## 2017-07-09 NOTE — Telephone Encounter (Signed)
Patient called wanted to talk to Dwayne Cooper about what he needs for the pharmaceutical phone# [phone]

## 2017-07-09 NOTE — Telephone Encounter (Signed)
No answer. I left message stating Tammy was trying to get his medication approved

## 2017-07-26 ENCOUNTER — Telehealth (INDEPENDENT_AMBULATORY_CARE_PROVIDER_SITE_OTHER): Payer: Self-pay | Admitting: Internal Medicine

## 2017-07-26 ENCOUNTER — Telehealth (INDEPENDENT_AMBULATORY_CARE_PROVIDER_SITE_OTHER): Payer: Self-pay | Admitting: *Deleted

## 2017-07-26 NOTE — Telephone Encounter (Signed)
Patient called about his Humaria packet stated he needs instructions - plse call at [phone]

## 2017-07-26 NOTE — Telephone Encounter (Signed)
Patient is coming in at 2 pm today , March 28 th to get started on his Humira.

## 2017-07-26 NOTE — Telephone Encounter (Signed)
Humira 40 mg /0.8 mL - injected to the right and left abdomen. [id number] LOT [id number]

## 2017-07-26 NOTE — Telephone Encounter (Signed)
Patient called the office this morning, he had just rec'd  his Humira. He presented to the office this afternoon. He was instructed about the Humira and shown how to administer the medication. He administered the second injection. All his questions were answered except for when he should stop the Apriso, which will be reviewed with Dr.Rehman, them the patient will be contacted.  Deberah Castle ,NP-C talked with patient to ensure that he understood and that all his questions were answered.

## 2017-08-02 ENCOUNTER — Telehealth (INDEPENDENT_AMBULATORY_CARE_PROVIDER_SITE_OTHER): Payer: Self-pay | Admitting: *Deleted

## 2017-08-02 NOTE — Telephone Encounter (Signed)
Patient is aware of appt on 08/28/17 at 3:30 with Terri and Dr Laural Golden will come in the room.

## 2017-08-02 NOTE — Telephone Encounter (Signed)
Patient started the Humira on 07/26/2017 . He has ask when he can stop the Apriso. Addressed with Dr.Rehman. He ask that the patient stay on the Apriso until sure of remission. Patient will need OV with Dr.Rehman end of April, 4 weeks from 07/26/2017.  Patient was called and given the recommendation.

## 2017-08-28 ENCOUNTER — Ambulatory Visit (INDEPENDENT_AMBULATORY_CARE_PROVIDER_SITE_OTHER): Payer: Self-pay | Admitting: Internal Medicine

## 2017-08-29 ENCOUNTER — Ambulatory Visit (INDEPENDENT_AMBULATORY_CARE_PROVIDER_SITE_OTHER): Payer: Self-pay | Admitting: Internal Medicine

## 2017-08-29 ENCOUNTER — Encounter (INDEPENDENT_AMBULATORY_CARE_PROVIDER_SITE_OTHER): Payer: Self-pay | Admitting: Internal Medicine

## 2017-08-29 VITALS — BP 180/90 | HR 72 | Temp 98.0°F | Ht 76.0 in | Wt 249.0 lb

## 2017-08-29 DIAGNOSIS — K50118 Crohn's disease of large intestine with other complication: Secondary | ICD-10-CM

## 2017-08-29 LAB — CBC
HCT: 35.8 % — ABNORMAL LOW (ref 38.5–50.0)
HEMOGLOBIN: 11.3 g/dL — AB (ref 13.2–17.1)
MCH: 23.6 pg — ABNORMAL LOW (ref 27.0–33.0)
MCHC: 31.6 g/dL — AB (ref 32.0–36.0)
MCV: 74.9 fL — ABNORMAL LOW (ref 80.0–100.0)
MPV: 10.3 fL (ref 7.5–12.5)
Platelets: 324 10*3/uL (ref 140–400)
RBC: 4.78 10*6/uL (ref 4.20–5.80)
RDW: 16.2 % — ABNORMAL HIGH (ref 11.0–15.0)
WBC: 7.8 10*3/uL (ref 3.8–10.8)

## 2017-08-29 NOTE — Progress Notes (Signed)
   Subjective:    Patient ID: Dwayne Cooper, male    DOB: 06/19/1975, 42 y.o.   MRN: [id number]  HPI Here today for f/u. Last seen in January. Hx of Crohn's disease. Had been on Apriso but really did not achieve remission.  He started Humira I 07/26/2016. Takes every 14 days.  He is having 3 stools a day. Stools are formed. No blood.  No abdominal pain. He says he feels 100% better. Appetite is good. Has lost 3 pounds since his visit in January.         Per Dr. Anthony Sar notes, inflammatory changes were consistent with Crohn's colitis. Underwent a colonoscopy 11/25/2016 for rectal bleeding. Circumferential diffuse abnormal mucosa was found thru out the entire examined colon. The mucosa had deep ulcers, scarring and granularity.  Biopsy: Colon: Acute colitis. No dysplasia or malignancy identified. Rectum biopsy: chronic active colitis. No dysplasia or malignancy identified.  Review of Systems Past Medical History:  Diagnosis Date  . Crohn's colitis ([place]) 01/03/2017  . Essential hypertension, benign 01/03/2017  . Rectal bleeding    chronic    No past surgical history on file.  No Known Allergies  Current Outpatient Medications on File Prior to Visit  Medication Sig Dispense Refill  . Adalimumab (HUMIRA) 40 MG/0.4ML PSKT Inject into the skin. Every 14 days.    Marland Kitchen amLODipine (NORVASC) 5 MG tablet Take 1 tablet (5 mg total) by mouth daily. (Patient taking differently: Take 10 mg daily by mouth. ) 30 tablet 0  . hydrocortisone (ANUSOL-HC) 25 MG suppository Place 1 suppository (25 mg total) rectally at bedtime. 30 suppository 2  . mesalamine (APRISO) 0.375 g 24 hr capsule Take 1 capsule (0.375 g total) by mouth daily. 4 tabs daily. 120 capsule 3  . Multiple Vitamin (MULTIVITAMIN WITH MINERALS) TABS tablet Take by mouth daily. 2 gummies     No current facility-administered medications on file prior to visit.         Objective:   Physical Exam Blood pressure (!) 180/90, pulse 72,  temperature 98 F (36.7 C), height 6' 4"  (1.93 m), weight 249 lb (112.9 kg). Alert and oriented. Skin warm and dry. Oral mucosa is moist.   . Sclera anicteric, conjunctivae is pink. Thyroid not enlarged. No cervical lymphadenopathy. Lungs clear. Heart regular rate and rhythm.  Abdomen is soft. Bowel sounds are positive. No hepatomegaly. No abdominal masses felt. No tenderness.  No edema to lower extremities.           Assessment & Plan:  Crohn's disease. Hopefully he is in remission. Will check a CBC and sedrate. Will continue the Apriso till we get results back.

## 2017-08-29 NOTE — Patient Instructions (Signed)
OV in 3 months.

## 2017-08-30 ENCOUNTER — Other Ambulatory Visit (INDEPENDENT_AMBULATORY_CARE_PROVIDER_SITE_OTHER): Payer: Self-pay | Admitting: *Deleted

## 2017-08-30 DIAGNOSIS — K50118 Crohn's disease of large intestine with other complication: Secondary | ICD-10-CM

## 2017-08-30 LAB — SEDIMENTATION RATE: Sed Rate: 11 mm/h (ref 0–15)

## 2017-10-16 ENCOUNTER — Telehealth (INDEPENDENT_AMBULATORY_CARE_PROVIDER_SITE_OTHER): Payer: Self-pay | Admitting: Internal Medicine

## 2017-10-16 NOTE — Telephone Encounter (Signed)
I have spoken with patient.  He will come by office and let me see if lower legs. (swelling)

## 2017-10-16 NOTE — Telephone Encounter (Signed)
Please let Dr. Laural Golden know that Dwayne Cooper came by office. He has some swelling to his ankle. (1+). Patient takes Humira. Didn't see where this was a side effect. There is no rash

## 2017-10-16 NOTE — Telephone Encounter (Signed)
Patient wants you to call him back at [phone]

## 2017-10-18 ENCOUNTER — Other Ambulatory Visit (INDEPENDENT_AMBULATORY_CARE_PROVIDER_SITE_OTHER): Payer: Self-pay | Admitting: *Deleted

## 2017-10-18 DIAGNOSIS — D508 Other iron deficiency anemias: Secondary | ICD-10-CM

## 2017-10-18 DIAGNOSIS — K5010 Crohn's disease of large intestine without complications: Secondary | ICD-10-CM

## 2017-10-18 DIAGNOSIS — K50118 Crohn's disease of large intestine with other complication: Secondary | ICD-10-CM

## 2017-10-18 NOTE — Telephone Encounter (Signed)
Per Dr.Rehman the patient needs to have lab work drawn. Patient was called and made aware.

## 2017-10-24 LAB — COMPREHENSIVE METABOLIC PANEL
AG RATIO: 1.7 (calc) (ref 1.0–2.5)
ALBUMIN MSPROF: 4.4 g/dL (ref 3.6–5.1)
ALKALINE PHOSPHATASE (APISO): 70 U/L (ref 40–115)
ALT: 20 U/L (ref 9–46)
AST: 23 U/L (ref 10–40)
BUN: 15 mg/dL (ref 7–25)
CHLORIDE: 108 mmol/L (ref 98–110)
CO2: 27 mmol/L (ref 20–32)
CREATININE: 0.98 mg/dL (ref 0.60–1.35)
Calcium: 9.1 mg/dL (ref 8.6–10.3)
GLOBULIN: 2.6 g/dL (ref 1.9–3.7)
Glucose, Bld: 97 mg/dL (ref 65–139)
POTASSIUM: 4.2 mmol/L (ref 3.5–5.3)
Sodium: 143 mmol/L (ref 135–146)
Total Bilirubin: 0.5 mg/dL (ref 0.2–1.2)
Total Protein: 7 g/dL (ref 6.1–8.1)

## 2017-10-24 LAB — CBC
HEMATOCRIT: 38.1 % — AB (ref 38.5–50.0)
Hemoglobin: 12.5 g/dL — ABNORMAL LOW (ref 13.2–17.1)
MCH: 24.3 pg — ABNORMAL LOW (ref 27.0–33.0)
MCHC: 32.8 g/dL (ref 32.0–36.0)
MCV: 74 fL — AB (ref 80.0–100.0)
MPV: 11.2 fL (ref 7.5–12.5)
Platelets: 244 10*3/uL (ref 140–400)
RBC: 5.15 10*6/uL (ref 4.20–5.80)
RDW: 16.5 % — AB (ref 11.0–15.0)
WBC: 5.8 10*3/uL (ref 3.8–10.8)

## 2017-10-24 LAB — SEDIMENTATION RATE: Sed Rate: 9 mm/h (ref 0–15)

## 2017-11-29 ENCOUNTER — Ambulatory Visit (INDEPENDENT_AMBULATORY_CARE_PROVIDER_SITE_OTHER): Payer: Self-pay | Admitting: Internal Medicine

## 2017-11-29 ENCOUNTER — Encounter (INDEPENDENT_AMBULATORY_CARE_PROVIDER_SITE_OTHER): Payer: Self-pay | Admitting: Internal Medicine

## 2017-11-29 VITALS — BP 180/100 | HR 72 | Temp 98.2°F | Ht 76.0 in | Wt 248.0 lb

## 2017-11-29 DIAGNOSIS — K50019 Crohn's disease of small intestine with unspecified complications: Secondary | ICD-10-CM

## 2017-11-29 NOTE — Progress Notes (Signed)
   Subjective:    Patient ID: Dwayne Cooper, male    DOB: [birth year], 42 y.o.   MRN: [id number]  HPI Here today for f/u. Last seen in June of this year. Hx of Crohn's disease. Started Hummira 07/26/2016. Takes every 14 days. Also takes Apriso. Takes 4 tabs a day. His appetite is good. Weight has remained the same. Having 2 BMs a day. No BRRB or mucous.  No nausea or vomiting. No GI complaints.  Continues to work full time. C/o lower extremity swelling   No melena or BRRB.        Per Dr. Anthony Sar notes, inflammatory changes were consistent with Crohn's colitis. Underwent a colonoscopy 11/25/2016 for rectal bleeding. Circumferential diffuse abnormal mucosa was found thru out the entire examined colon. The mucosa had deep ulcers, scarring and granularity.  Biopsy: Colon: Acute colitis. No dysplasia or malignancy identified. Rectum biopsy: chronic active colitis. No dysplasia or malignancy identified. Review of Systems Past Medical History:  Diagnosis Date  . Crohn's colitis ([place]) 01/03/2017  . Essential hypertension, benign 01/03/2017  . Rectal bleeding    chronic    No past surgical history on file.  No Known Allergies  Current Outpatient Medications on File Prior to Visit  Medication Sig Dispense Refill  . Adalimumab (HUMIRA) 40 MG/0.4ML PSKT Inject into the skin. Every 14 days.    Marland Kitchen amLODipine (NORVASC) 5 MG tablet Take 1 tablet (5 mg total) by mouth daily. (Patient taking differently: Take 10 mg daily by mouth. ) 30 tablet 0  . hydrocortisone (ANUSOL-HC) 25 MG suppository Place 1 suppository (25 mg total) rectally at bedtime. 30 suppository 2  . mesalamine (APRISO) 0.375 g 24 hr capsule Take 1 capsule (0.375 g total) by mouth daily. 4 tabs daily. 120 capsule 3  . Multiple Vitamin (MULTIVITAMIN WITH MINERALS) TABS tablet Take by mouth daily. 2 gummies     No current facility-administered medications on file prior to visit.         Objective:   Physical Exam Blood pressure (!)  180/100, pulse 72, temperature 98.2 F (36.8 C), height 6' 4"  (1.93 m), weight 248 lb (112.5 kg).   Alert and oriented. Skin warm and dry. Oral mucosa is moist.   . Sclera anicteric, conjunctivae is pink. Thyroid not enlarged. No cervical lymphadenopathy. Lungs clear. Heart regular rate and rhythm.  Abdomen is soft. Bowel sounds are positive. No hepatomegaly. No abdominal masses felt. No tenderness.  1+ edema to lower extremities.        Assessment & Plan:  Crohn's. He seems to be doing well. Will get a CBC and CRP today. OV in 6 month.

## 2017-11-29 NOTE — Patient Instructions (Signed)
Labs today. OV in 6 months.

## 2017-11-30 LAB — CBC WITH DIFFERENTIAL/PLATELET
Basophils Absolute: 21 cells/uL (ref 0–200)
Basophils Relative: 0.3 %
EOS PCT: 2.9 %
Eosinophils Absolute: 200 cells/uL (ref 15–500)
HCT: 41.4 % (ref 38.5–50.0)
Hemoglobin: 13.3 g/dL (ref 13.2–17.1)
Lymphs Abs: 1691 cells/uL (ref 850–3900)
MCH: 24.7 pg — ABNORMAL LOW (ref 27.0–33.0)
MCHC: 32.1 g/dL (ref 32.0–36.0)
MCV: 76.8 fL — ABNORMAL LOW (ref 80.0–100.0)
MONOS PCT: 6.9 %
MPV: 10.9 fL (ref 7.5–12.5)
Neutro Abs: 4513 cells/uL (ref 1500–7800)
Neutrophils Relative %: 65.4 %
PLATELETS: 259 10*3/uL (ref 140–400)
RBC: 5.39 10*6/uL (ref 4.20–5.80)
RDW: 18.4 % — AB (ref 11.0–15.0)
TOTAL LYMPHOCYTE: 24.5 %
WBC mixed population: 476 cells/uL (ref 200–950)
WBC: 6.9 10*3/uL (ref 3.8–10.8)

## 2017-11-30 LAB — C-REACTIVE PROTEIN: CRP: 3.4 mg/L (ref <8.0)

## 2018-05-22 ENCOUNTER — Other Ambulatory Visit (INDEPENDENT_AMBULATORY_CARE_PROVIDER_SITE_OTHER): Payer: Self-pay | Admitting: *Deleted

## 2018-05-22 DIAGNOSIS — K50019 Crohn's disease of small intestine with unspecified complications: Secondary | ICD-10-CM

## 2018-05-22 MED ORDER — MESALAMINE ER 0.375 G PO CP24: 375.0000 mg | ORAL_CAPSULE | Freq: Every day | ORAL | 3 refills | Status: DC

## 2018-05-22 MED ORDER — ADALIMUMAB 40 MG/0.4ML ~~LOC~~ PSKT: 40.0000 mg | PREFILLED_SYRINGE | SUBCUTANEOUS | 11 refills | Status: DC

## 2018-06-03 ENCOUNTER — Ambulatory Visit (INDEPENDENT_AMBULATORY_CARE_PROVIDER_SITE_OTHER): Payer: Self-pay | Admitting: Internal Medicine

## 2018-06-03 ENCOUNTER — Encounter (INDEPENDENT_AMBULATORY_CARE_PROVIDER_SITE_OTHER): Payer: Self-pay | Admitting: Internal Medicine

## 2018-06-03 VITALS — BP 152/94 | HR 82 | Temp 97.7°F | Resp 18 | Ht 76.0 in | Wt 258.1 lb

## 2018-06-03 DIAGNOSIS — K50818 Crohn's disease of both small and large intestine with other complication: Secondary | ICD-10-CM

## 2018-06-03 NOTE — Progress Notes (Signed)
Presenting complaint;  Follow-up for Crohn's disease.  Database and subjective:  Patient is a 43 year old Caucasian male who was diagnosed with Crohn's disease back in July 2018 when he was admitted to [facility] for rectal bleeding and diarrhea.  He underwent colonoscopy by Dr. Arlester Marker and found to have acute colitis.  Biopsy consistent with Crohn's disease.  Patient was initially treated with prednisone and then begun on oral mesalamine.  He was admitted to [facility] in February last year with persistent rectal bleeding.  He was noted to have iron deficiency anemia CT revealed changes of Crohn's disease involving small bowel as well as colon.  Once again he was treated with prednisone and antibiotics and improved.  He was also given iron infusion x2.  Patient was begun on Humira in March last year.  Since then he has done better.  He was last seen in August 2019 and is here for scheduled visit.  Patient says he is doing well.  He generally has 2-3 bowel movements per day in the first week after taking Humira shots.  His stools are formed.  However in the second week he has 4-5 stools.  His stools are loose.  He is not having melena or rectal bleeding or abdominal pain.  His appetite is good.  He denies nausea or vomiting.  He takes ibuprofen no more than once a week for joint pain or headache etc.  He states he has been under a lot of stress since his mother-in-law was recently diagnosed with advanced liver cancer and has poor prognosis.   Current Medications: Outpatient Encounter Medications as of 06/03/2018  Medication Sig  . Adalimumab (HUMIRA) 40 MG/0.4ML PSKT Inject 40 mg into the skin every 14 (fourteen) days. Every 14 days.  Marland Kitchen amLODipine (NORVASC) 5 MG tablet Take 1 tablet (5 mg total) by mouth daily. (Patient taking differently: Take 10 mg daily by mouth. )  . hydrocortisone (ANUSOL-HC) 25 MG suppository Place 1 suppository (25 mg total) rectally at bedtime.  . mesalamine (APRISO) 0.375 g 24  hr capsule Take 1 capsule (0.375 g total) by mouth daily. 4 tabs daily.  . Multiple Vitamin (MULTIVITAMIN WITH MINERALS) TABS tablet Take by mouth daily. 2 gummies   No facility-administered encounter medications on file as of 06/03/2018.      Objective: Blood pressure (!) 152/94, pulse 82, temperature 97.7 F (36.5 C), temperature source Oral, resp. rate 18, height _0  (1.93 m), weight 258 lb 1.6 oz (117.1 kg). Patient is alert and in no acute distress. Conjunctiva is pink. Sclera is nonicteric Oropharyngeal mucosa is normal. No neck masses or thyromegaly noted. Cardiac exam with regular rhythm normal S1 and S2. No murmur or gallop noted. Lungs are clear to auscultation. Abdomen abdomen is full but soft and nontender with organomegaly or masses. No LE edema or clubbing noted.  Labs/studies Results:  CBC Latest Ref Rng & Units 11/29/2017 10/24/2017 08/29/2017  WBC 3.8 - 10.8 Thousand/uL 6.9 5.8 7.8  Hemoglobin 13.2 - 17.1 g/dL 13.3 12.5(L) 11.3(L)  Hematocrit 38.5 - 50.0 % 41.4 38.1(L) 35.8(L)  Platelets 140 - 400 Thousand/uL 259 244 324    CMP Latest Ref Rng & Units 10/24/2017 06/21/2017 06/20/2017  Glucose 65 - 139 mg/dL 97 119(H) 126(H)  BUN 7 - 25 mg/dL _1 Creatinine 0.60 - 1.35 mg/dL 0.98 0.73 0.80  Sodium 135 - 146 mmol/L 143 136 137  Potassium 3.5 - 5.3 mmol/L 4.2 4.0 3.9  Chloride 98 - 110 mmol/L 108  102 103  CO2 20 - 32 mmol/L _0 Calcium 8.6 - 10.3 mg/dL 9.1 8.3(L) 8.3(L)  Total Protein 6.1 - 8.1 g/dL 7.0 - -  Total Bilirubin 0.2 - 1.2 mg/dL 0.5 - -  Alkaline Phos 38 - 126 U/L - - -  AST 10 - 40 U/L 23 - -  ALT 9 - 46 U/L 20 - -    Hepatic Function Latest Ref Rng & Units 10/24/2017 06/19/2017  Total Protein 6.1 - 8.1 g/dL 7.0 5.7(L)  Albumin 3.5 - 5.0 g/dL - 2.5(L)  AST 10 - 40 U/L 23 12(L)  ALT 9 - 46 U/L 20 15(L)  Alk Phosphatase 38 - 126 U/L - 53  Total Bilirubin 0.2 - 1.2 mg/dL 0.5 0.5    Lab Results  Component Value Date   CRP 3.4 11/29/2017       Assessment:  #1.  Ileocolonic Crohn's disease of over 2 years duration.  He is currently on adalimumab and oral mesalamine.  He has normal bowel movements for 1 week after taking biologic but he has diarrhea in the second week.  Therefore he may benefit from biologic every week.  Before dose changed for check fecal calprotectin to see if it is elevated.  #2.  History of iron deficiency anemia.  H&H was normal 6 months ago.  Plan:  Patient will continue adalimumab and oral mesalamine at current dose. He can take Imodium OTC 2 mg daily PRN. Patient will go to the lab for CBC with differential, comprehensive chemistry panel and fecal calprotectin. Further recommendations to follow. Office visit in 6 months.

## 2018-06-03 NOTE — Patient Instructions (Addendum)
Physician will call with results of blood and stool test when completed. Imodium OTC 2 mg daily PRN for diarrhea.

## 2018-06-07 ENCOUNTER — Other Ambulatory Visit: Payer: Self-pay

## 2018-06-07 ENCOUNTER — Encounter (HOSPITAL_COMMUNITY): Payer: Self-pay | Admitting: Emergency Medicine

## 2018-06-07 ENCOUNTER — Emergency Department (HOSPITAL_COMMUNITY)
Admission: EM | Admit: 2018-06-07 | Discharge: 2018-06-07 | Disposition: A | Discharge status: Home / Self Care | Payer: BLUE CROSS/BLUE SHIELD | Attending: Emergency Medicine | Admitting: Emergency Medicine

## 2018-06-07 DIAGNOSIS — R600 Localized edema: Secondary | ICD-10-CM | POA: Diagnosis present

## 2018-06-07 DIAGNOSIS — I10 Essential (primary) hypertension: Secondary | ICD-10-CM | POA: Diagnosis not present

## 2018-06-07 DIAGNOSIS — H05011 Cellulitis of right orbit: Secondary | ICD-10-CM | POA: Diagnosis not present

## 2018-06-07 DIAGNOSIS — Z79899 Other long term (current) drug therapy: Secondary | ICD-10-CM | POA: Insufficient documentation

## 2018-06-07 DIAGNOSIS — H00033 Abscess of eyelid right eye, unspecified eyelid: Secondary | ICD-10-CM

## 2018-06-07 LAB — COMPREHENSIVE METABOLIC PANEL
AG Ratio: 1.6 (calc) (ref 1.0–2.5)
ALT: 28 U/L (ref 9–46)
AST: 20 U/L (ref 10–40)
Albumin: 4.1 g/dL (ref 3.6–5.1)
Alkaline phosphatase (APISO): 77 U/L (ref 36–130)
BILIRUBIN TOTAL: 0.5 mg/dL (ref 0.2–1.2)
BUN: 17 mg/dL (ref 7–25)
CALCIUM: 9 mg/dL (ref 8.6–10.3)
CO2: 28 mmol/L (ref 20–32)
Chloride: 106 mmol/L (ref 98–110)
Creat: 1.08 mg/dL (ref 0.60–1.35)
Globulin: 2.6 g/dL (calc) (ref 1.9–3.7)
Glucose, Bld: 96 mg/dL (ref 65–139)
POTASSIUM: 3.9 mmol/L (ref 3.5–5.3)
Sodium: 142 mmol/L (ref 135–146)
Total Protein: 6.7 g/dL (ref 6.1–8.1)

## 2018-06-07 LAB — CBC WITH DIFFERENTIAL/PLATELET
ABSOLUTE MONOCYTES: 634 {cells}/uL (ref 200–950)
BASOS ABS: 29 {cells}/uL (ref 0–200)
Basophils Relative: 0.4 %
Eosinophils Absolute: 230 cells/uL (ref 15–500)
Eosinophils Relative: 3.2 %
HEMATOCRIT: 42.9 % (ref 38.5–50.0)
Hemoglobin: 14.1 g/dL (ref 13.2–17.1)
LYMPHS ABS: 1750 {cells}/uL (ref 850–3900)
MCH: 27.5 pg (ref 27.0–33.0)
MCHC: 32.9 g/dL (ref 32.0–36.0)
MCV: 83.8 fL (ref 80.0–100.0)
MPV: 10.7 fL (ref 7.5–12.5)
Monocytes Relative: 8.8 %
NEUTROS PCT: 63.3 %
Neutro Abs: 4558 cells/uL (ref 1500–7800)
PLATELETS: 290 10*3/uL (ref 140–400)
RBC: 5.12 10*6/uL (ref 4.20–5.80)
RDW: 14.2 % (ref 11.0–15.0)
TOTAL LYMPHOCYTE: 24.3 %
WBC: 7.2 10*3/uL (ref 3.8–10.8)

## 2018-06-07 MED ORDER — DOXYCYCLINE HYCLATE 100 MG PO CAPS: 100.0000 mg | ORAL_CAPSULE | Freq: Two times a day (BID) | ORAL | 0 refills | Status: DC

## 2018-06-07 NOTE — ED Triage Notes (Signed)
Patient reports R eye edema with redness, no itching or discharge. Symptom onset yesterday.

## 2018-06-07 NOTE — ED Provider Notes (Signed)
[facility] EMERGENCY DEPARTMENT Provider Note   CSN: [id number] Arrival date & time: 06/07/18  1013     History   Chief Complaint Chief Complaint  Patient presents with  . Eye Problem    HPI Dwayne Cooper is a 43 y.o. male.  The history is provided by the patient. No language interpreter was used.  Eye Pain  This is a new problem. The current episode started more than 1 week ago. The problem occurs constantly. The problem has been gradually worsening. Nothing aggravates the symptoms. Nothing relieves the symptoms. He has tried nothing for the symptoms. The treatment provided no relief.   Pt complains of swelling to his right eyebrow and upper eyelid.  Pt reports he had a pimple in his eyebrow and after trying to pop area it began to swell Past Medical History:  Diagnosis Date  . Crohn's colitis ([place]) 01/03/2017  . Essential hypertension, benign 01/03/2017  . Rectal bleeding    chronic    Patient Active Problem List   Diagnosis Date Noted  . Crohn's colitis, with rectal bleeding ([place]) 06/19/2017  . Iron deficiency anemia due to chronic blood loss 06/19/2017  . Protein-calorie malnutrition, severe 06/19/2017  . Crohn disease ([place]) 06/18/2017  . Essential hypertension, benign 01/03/2017  . Crohn's colitis ([place]) 01/03/2017    History reviewed. No pertinent surgical history.      Home Medications    Prior to Admission medications   Medication Sig Start Date End Date Taking? Authorizing Provider  Adalimumab (HUMIRA) 40 MG/0.4ML PSKT Inject 40 mg into the skin every 14 (fourteen) days. Every 14 days. 05/22/18   Setzer, Rona Ravens, NP  amLODipine (NORVASC) 5 MG tablet Take 1 tablet (5 mg total) by mouth daily. Patient taking differently: Take 10 mg daily by mouth.  01/03/17   Setzer, Rona Ravens, NP  mesalamine (APRISO) 0.375 g 24 hr capsule Take 1 capsule (0.375 g total) by mouth daily. 4 tabs daily. 05/22/18   Setzer, Rona Ravens, NP  Multiple Vitamin (MULTIVITAMIN WITH MINERALS) TABS  tablet Take by mouth daily. 2 gummies    [provider]    Family History History reviewed. No pertinent family history.  Social History Social History   Tobacco Use  . Smoking status: Never Smoker  . Smokeless tobacco: Never Used  Substance Use Topics  . Alcohol use: No  . Drug use: No     Allergies   Patient has no known allergies.   Review of Systems Review of Systems  Eyes: Positive for discharge.  All other systems reviewed and are negative.    Physical Exam Updated Vital Signs BP (!) 155/94 (BP Location: Right Arm)   Pulse 93   Temp 98.1 F (36.7 C) (Oral)   Resp 17   Ht 6' 4"  (1.93 m)   Wt 115.7 kg   SpO2 100%   BMI 31.04 kg/m   Physical Exam Vitals signs and nursing note reviewed.  Constitutional:      Appearance: He is well-developed.  HENT:     Head: Normocephalic.     Nose: Nose normal.     Mouth/Throat:     Mouth: Mucous membranes are moist.  Eyes:     Pupils: Pupils are equal, round, and reactive to light.  Neck:     Musculoskeletal: Normal range of motion.  Cardiovascular:     Rate and Rhythm: Normal rate.  Pulmonary:     Effort: Pulmonary effort is normal.  Abdominal:     General: There  is no distension.  Musculoskeletal: Normal range of motion.  Skin:    Comments: Swelling right eyelid and right eyebrow,   Neurological:     Mental Status: He is alert and oriented to person, place, and time.  Psychiatric:        Mood and Affect: Mood normal.      ED Treatments / Results  Labs (all labs ordered are listed, but only abnormal results are displayed) Labs Reviewed - No data to display  EKG None  Radiology No results found.  Procedures Procedures (including critical care time)  Medications Ordered in ED Medications - No data to display   Initial Impression / Assessment and Plan / ED Course  I have reviewed the triage vital signs and the nursing notes.  Pertinent labs & imaging results that were available  during my care of the patient were reviewed by me and considered in my medical decision making (see chart for details).       Final Clinical Impressions(s) / ED Diagnoses   Final diagnoses:  Cellulitis of eyelid, right    ED Discharge Orders    None    An After Visit Summary was printed and given to the patient.    Fransico Meadow, [place] 06/07/18 1355    Lajean Saver, MD 06/07/18 [phone]

## 2018-06-07 NOTE — Discharge Instructions (Signed)
Warm compresses 20 minutes 4 times a day.  Return if any problems.

## 2018-12-03 ENCOUNTER — Ambulatory Visit (INDEPENDENT_AMBULATORY_CARE_PROVIDER_SITE_OTHER): Payer: BC Managed Care – PPO | Admitting: Internal Medicine

## 2018-12-03 ENCOUNTER — Other Ambulatory Visit: Payer: Self-pay

## 2018-12-03 ENCOUNTER — Encounter (INDEPENDENT_AMBULATORY_CARE_PROVIDER_SITE_OTHER): Payer: Self-pay | Admitting: Internal Medicine

## 2018-12-03 VITALS — BP 106/68 | HR 79 | Temp 97.9°F | Resp 18 | Ht 76.0 in | Wt 249.7 lb

## 2018-12-03 DIAGNOSIS — K5080 Crohn's disease of both small and large intestine without complications: Secondary | ICD-10-CM

## 2018-12-03 MED ORDER — LOPERAMIDE HCL 2 MG PO CAPS: 4.0000 mg | ORAL_CAPSULE | Freq: Every day | ORAL | 5 refills | Status: DC

## 2018-12-03 NOTE — Patient Instructions (Signed)
Physician will call with results of blood test when completed.

## 2018-12-03 NOTE — Progress Notes (Signed)
Presenting complaint;  Follow-up for Crohn's colitis.  Subjective:  Patient is 43 year old Caucasian male with two year history of Crohn's colitis who is here for scheduled visit.  He was last seen 6 months ago.  He has no complaints.  He states his stool frequency has decreased to 2 to 3/day since he has been taking 4 mg of Imodium every morning.  First stool is usually formed and 2 subsequent stools are loose.  However he does not have rectal bleeding abdominal cramping or urgency and has not had any accidents.  He also denies nocturnal bowel movement.  He is not having any side effects with adalimumab. He has lost 9 pounds since his last visit.  He states weight loss is voluntary and he is eating small portions.  Current Medications: Outpatient Encounter Medications as of 12/03/2018  Medication Sig  . Adalimumab (HUMIRA) 40 MG/0.4ML PSKT Inject 40 mg into the skin every 14 (fourteen) days. Every 14 days.  Marland Kitchen amLODipine (NORVASC) 5 MG tablet Take 1 tablet (5 mg total) by mouth daily. (Patient taking differently: Take 10 mg daily by mouth. )  . loperamide (IMODIUM) 2 MG capsule Take 2 mg by mouth. Patient takes( two) 2 mg  Every morning @@ 4 am.  . mesalamine (APRISO) 0.375 g 24 hr capsule Take 1 capsule (0.375 g total) by mouth daily. 4 tabs daily.  . [DISCONTINUED] doxycycline (VIBRAMYCIN) 100 MG capsule Take 1 capsule (100 mg total) by mouth 2 (two) times daily. (Patient not taking: Reported on 12/03/2018)  . [DISCONTINUED] Multiple Vitamin (MULTIVITAMIN WITH MINERALS) TABS tablet Take by mouth daily. 2 gummies   No facility-administered encounter medications on file as of 12/03/2018.      Objective: Blood pressure 106/68, pulse 79, temperature 97.9 F (36.6 C), temperature source Oral, resp. rate 18, height _0  (1.93 m), weight 249 lb 11.2 oz (113.3 kg). Patient is alert and in no acute distress. Conjunctiva is pink. Sclera is nonicteric Oropharyngeal mucosa is normal. No neck masses  or thyromegaly noted. Cardiac exam with regular rhythm normal S1 and S2. No murmur or gallop noted. Lungs are clear to auscultation. Abdomen is full but soft and nontender with organomegaly or masses. No LE edema or clubbing noted.  Labs/studies Results:  CBC Latest Ref Rng & Units 06/07/2018 11/29/2017 10/24/2017  WBC 3.8 - 10.8 Thousand/uL 7.2 6.9 5.8  Hemoglobin 13.2 - 17.1 g/dL 14.1 13.3 12.5(L)  Hematocrit 38.5 - 50.0 % 42.9 41.4 38.1(L)  Platelets 140 - 400 Thousand/uL 290 259 244    CMP Latest Ref Rng & Units 06/07/2018 10/24/2017 06/21/2017  Glucose 65 - 139 mg/dL 96 97 119(H)  BUN 7 - 25 mg/dL _1 Creatinine 0.60 - 1.35 mg/dL 1.08 0.98 0.73  Sodium 135 - 146 mmol/L 142 143 136  Potassium 3.5 - 5.3 mmol/L 3.9 4.2 4.0  Chloride 98 - 110 mmol/L 106 108 102  CO2 20 - 32 mmol/L _2 Calcium 8.6 - 10.3 mg/dL 9.0 9.1 8.3(L)  Total Protein 6.1 - 8.1 g/dL 6.7 7.0 -  Total Bilirubin 0.2 - 1.2 mg/dL 0.5 0.5 -  Alkaline Phos 38 - 126 U/L - - -  AST 10 - 40 U/L 20 23 -  ALT 9 - 46 U/L 28 20 -    Hepatic Function Latest Ref Rng & Units 06/07/2018 10/24/2017 06/19/2017  Total Protein 6.1 - 8.1 g/dL 6.7 7.0 5.7(L)  Albumin 3.5 - 5.0 g/dL - - 2.5(L)  AST 10 -  40 U/L 20 23 12(L)  ALT 9 - 46 U/L 28 20 15(L)  Alk Phosphatase 38 - 126 U/L - - 53  Total Bilirubin 0.2 - 1.2 mg/dL 0.5 0.5 0.5      Assessment:  #1.  Crohn's colitis.  Disease duration 2 years.  Stool frequency has decreased with Imodium which she is taking daily.  He does not have rectal bleeding or abdominal cramping to suggest active disease.  I do not feel oral mesalamine is helping much.  Unless his CRP is markedly elevated would be inclined to stop Apriso and keep him on adalimumab as long as it is working and he is not having any side effects.  Plan:  Patient reminded not to use OTC NSAIDs unless absolutely necessary and only at lowest possible dose. He can use Tylenol up to 2 g/day in divided dose as needed for  musculoskeletal pain. Patient will go to the lab for CBC with differential and CRP. Prescription given for loperamide 4 mg p.o. daily. Office visit in 6 months.

## 2018-12-04 ENCOUNTER — Other Ambulatory Visit (INDEPENDENT_AMBULATORY_CARE_PROVIDER_SITE_OTHER): Payer: Self-pay | Admitting: Internal Medicine

## 2018-12-04 LAB — CBC WITH DIFFERENTIAL/PLATELET
Absolute Monocytes: 473 {cells}/uL (ref 200–950)
Basophils Absolute: 32 {cells}/uL (ref 0–200)
Basophils Relative: 0.5 %
Eosinophils Absolute: 258 {cells}/uL (ref 15–500)
Eosinophils Relative: 4.1 %
HCT: 38.6 % (ref 38.5–50.0)
Hemoglobin: 13 g/dL — ABNORMAL LOW (ref 13.2–17.1)
Lymphs Abs: 2041 {cells}/uL (ref 850–3900)
MCH: 29 pg (ref 27.0–33.0)
MCHC: 33.7 g/dL (ref 32.0–36.0)
MCV: 86.2 fL (ref 80.0–100.0)
MPV: 10.6 fL (ref 7.5–12.5)
Monocytes Relative: 7.5 %
Neutro Abs: 3497 {cells}/uL (ref 1500–7800)
Neutrophils Relative %: 55.5 %
Platelets: 302 Thousand/uL (ref 140–400)
RBC: 4.48 Million/uL (ref 4.20–5.80)
RDW: 13.8 % (ref 11.0–15.0)
Total Lymphocyte: 32.4 %
WBC: 6.3 Thousand/uL (ref 3.8–10.8)

## 2018-12-04 LAB — C-REACTIVE PROTEIN: CRP: 8.1 mg/L — ABNORMAL HIGH (ref <8.0)

## 2019-03-16 IMAGING — CT CT ABD-PELV W/ CM
2 of 5 series · 15 of 46 positions shown, 17 images · IV contrast (Isovue)
Comparison: 11/23/2016

CLINICAL DATA: Crohn's disease with rectal bleeding x1 year.
Patient passed out last week.

EXAM:
CT ABDOMEN AND PELVIS WITH CONTRAST
TECHNIQUE: Multidetector CT imaging of the abdomen and pelvis was performed
using the standard protocol following bolus administration of
intravenous contrast.
CONTRAST:  100mL [id number] IOPAMIDOL ([id number]) INJECTION 61%

[Series 2: axial st · axial · 0.81mm/px · z∈[+831,+1341]mm · 12 of 116 slices shown, 14 images]
[im 7/116  soft-tissue]
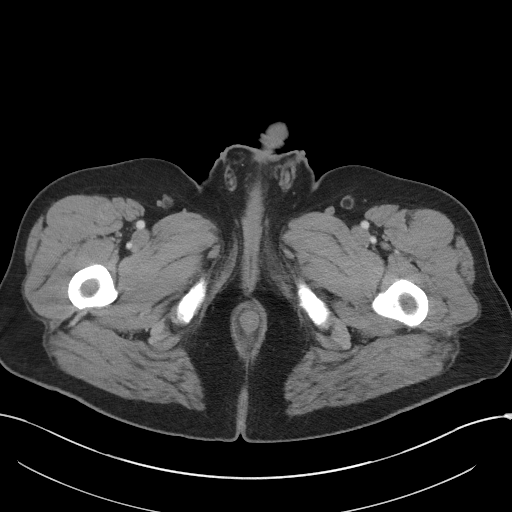
[im 7/116  bone]
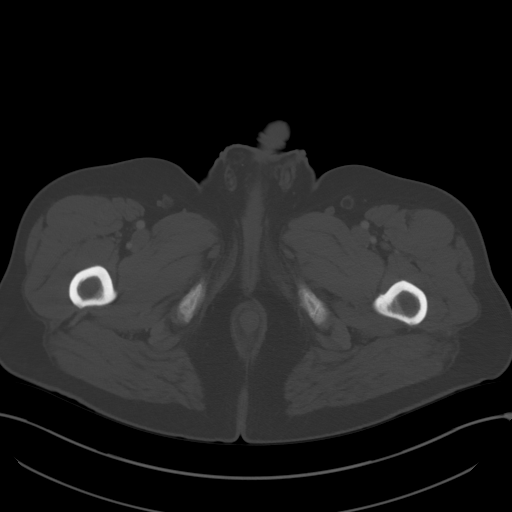
[im 20/116  soft-tissue]
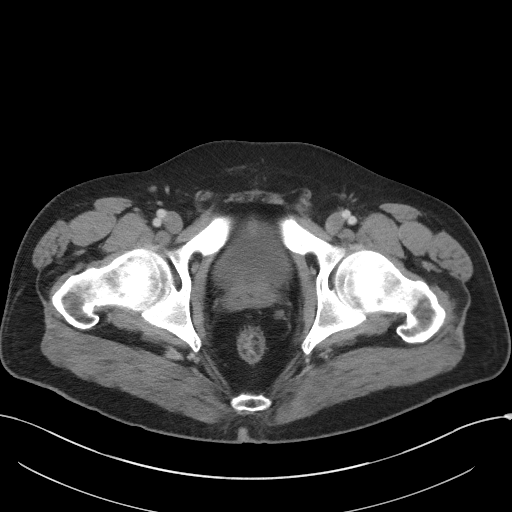
[im 26/116  soft-tissue]
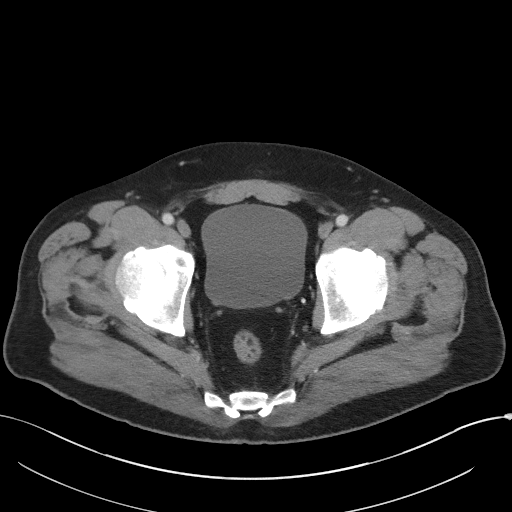
[im 32/116  soft-tissue]
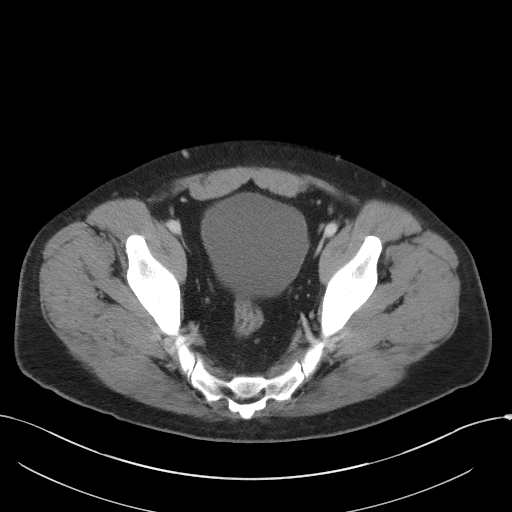
[im 45/116  soft-tissue]
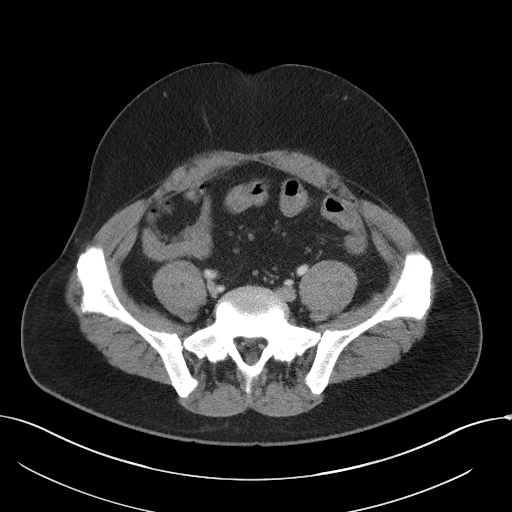
[im 52/116  soft-tissue]
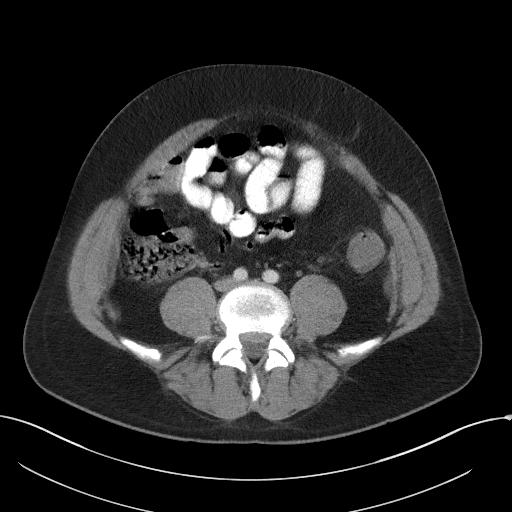
[im 64/116  soft-tissue]
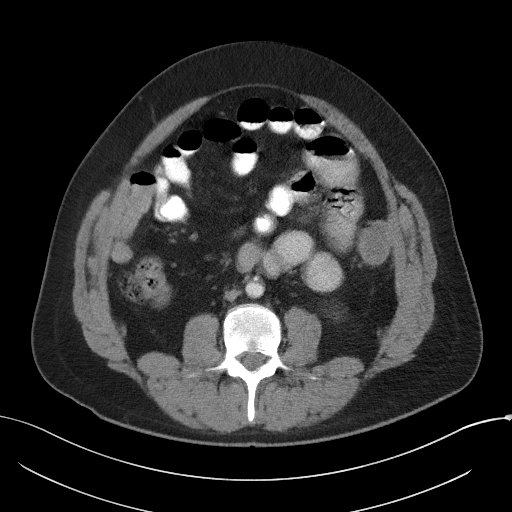
[im 71/116  soft-tissue]
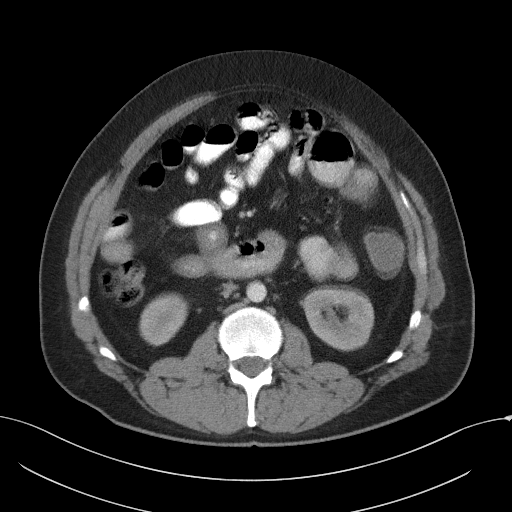
[im 84/116  soft-tissue]
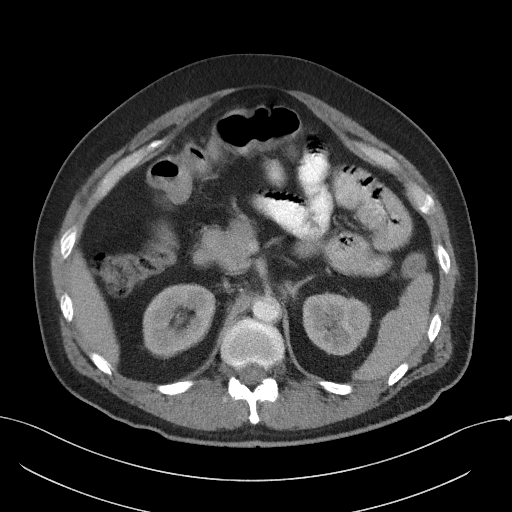
[im 84/116  bone]
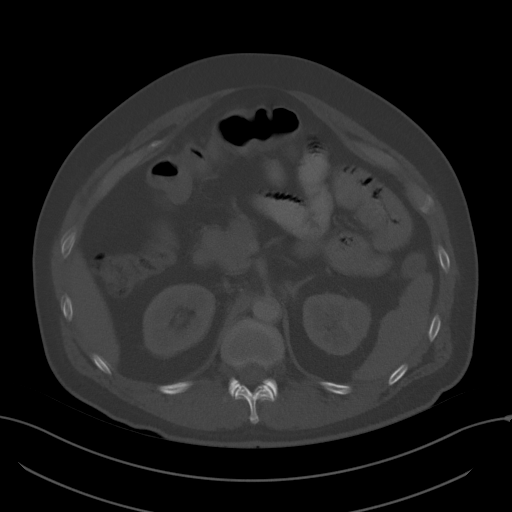
[im 90/116  soft-tissue]
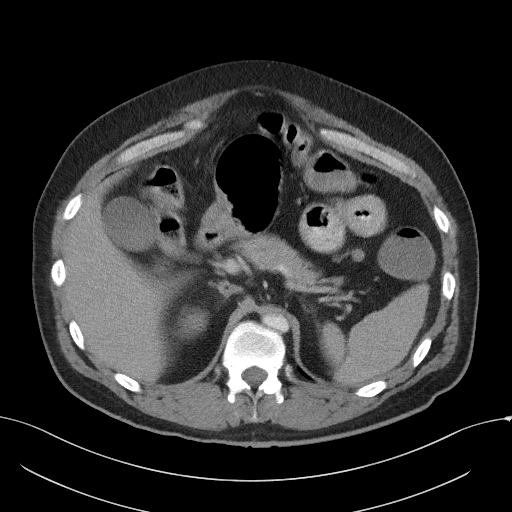
[im 96/116  soft-tissue]
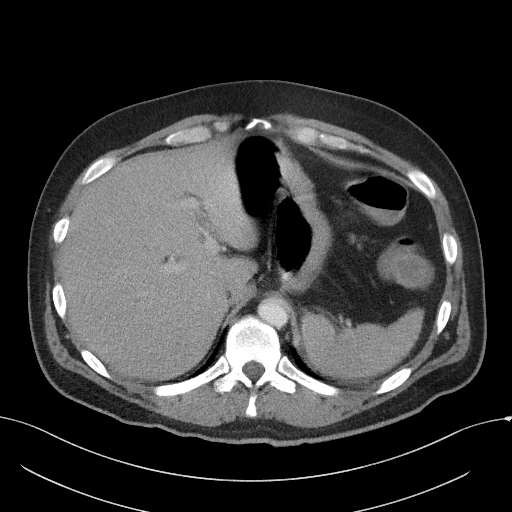
[im 109/116  soft-tissue]
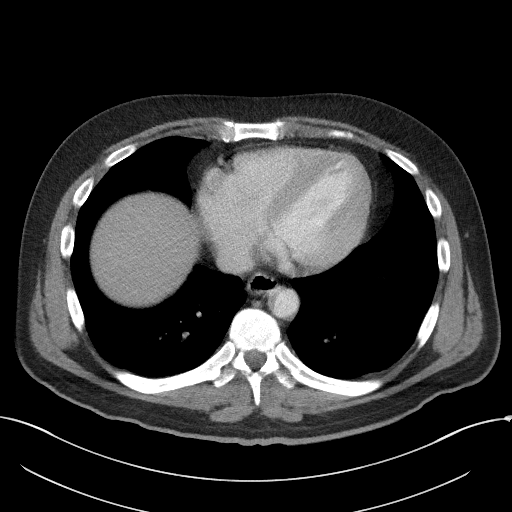

[Series 4: coronal st · coronal · 0.78mm/px · 3 of 108 slices shown]
[im 36/108  soft-tissue]
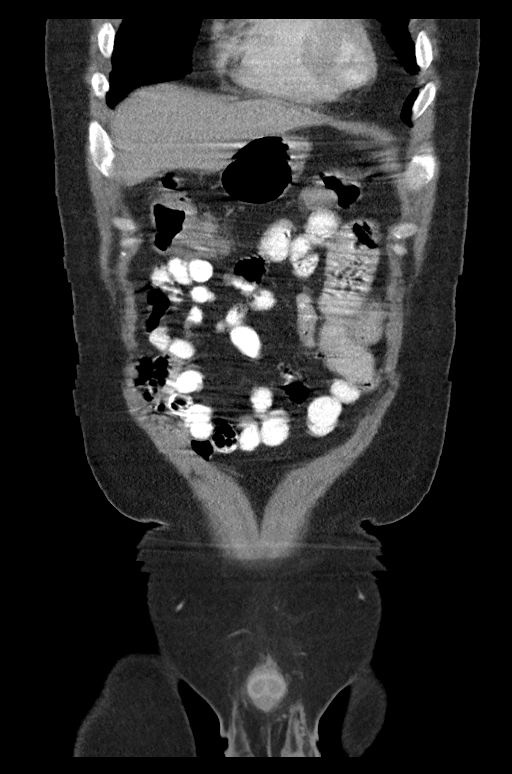
[im 48/108  soft-tissue]
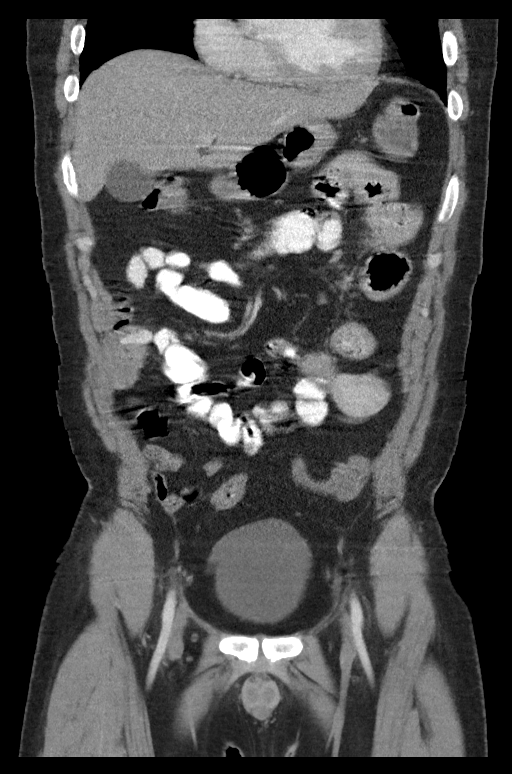
[im 60/108  soft-tissue]
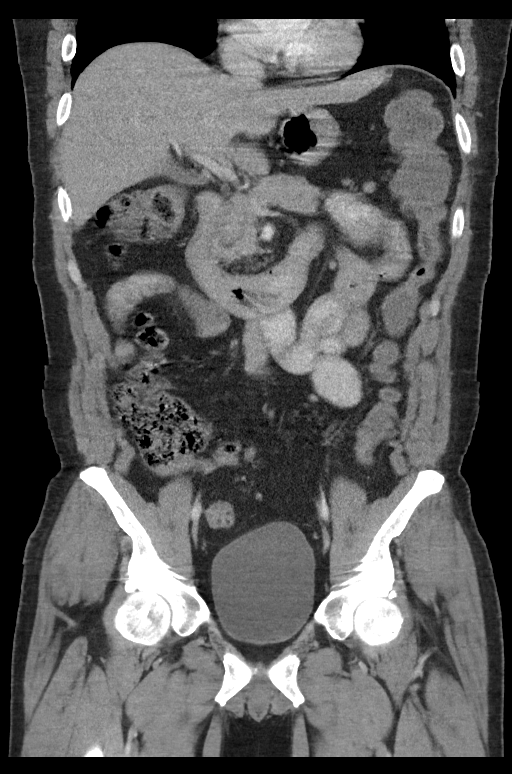

[15 of 46 positions shown; findings below may reference images not displayed]

FINDINGS: LOWER CHEST: Lung bases are clear. Included heart size is normal.
Small hiatal hernia. No pericardial effusion.

HEPATOBILIARY: Liver and gallbladder are normal.

PANCREAS: Normal.

SPLEEN: Normal.

ADRENALS/URINARY TRACT: Kidneys are orthotopic, demonstrating
symmetric enhancement. No nephrolithiasis, hydronephrosis or solid
renal masses. Urinary bladder is partially distended and
unremarkable. Normal adrenal glands.

STOMACH/BOWEL: Nondistended stomach with normal small bowel
rotation. Mild diffuse distal ileal thickening in the right lower
quadrant compatible with changes of ileitis and underlying patient
history of Crohn's disease. No pseudo sacculation 0 bowel
obstructions. No fistulous connection. Moderate stool burden within
nondistended colon. Submucosal fat deposition with mild-to-moderate
luminal narrowing of the rectosigmoid compatible which changes of
chronic inflammatory bowel disease. Normal appendix.

VASCULAR/LYMPHATIC: No aortic aneurysm. Patent branch vessels. No
lymphadenopathy.

REPRODUCTIVE: Normal size prostate.

OTHER: No intraperitoneal free fluid or free air.

MUSCULOSKELETAL: Sacroiliac joints are unremarkable. Slight disc
space narrowing L4-5 and L5-S1. No acute nor suspicious osseous
lesions.
IMPRESSION: 1. Mild luminal narrowing and submucosal fat deposition along the
rectosigmoid compatible with changes of chronic inflammatory bowel
disease. More active inflammatory change noted of the distal and
terminal ileum with moderate transmural thickening noted.
2. No bowel obstruction or free air.
3. No acute solid organ pathology.
4. Mild disc space narrowing L4-5 and L5-S1.

## 2019-04-15 ENCOUNTER — Telehealth (INDEPENDENT_AMBULATORY_CARE_PROVIDER_SITE_OTHER): Payer: Self-pay | Admitting: *Deleted

## 2019-04-15 NOTE — Telephone Encounter (Signed)
Patient's wife states that the patient was out of work 12/06/18,12/18/18,01/16/19, and 03/03/19 with severe diarrhea. Patient has a history of Crohn's disease.  She states that  And the patient also called today and left a message that he he needed a noted for those days , that he had severe diarrhea and that this is related to his Crohn's Disease. Both state that without this note he has been told that he will be fired from his job.ent was last seen in our office 12/03/2018.  Patient also needs a second note stating that he will need to be allowed to go to the bathroom , due to his condition.  It appears that the patient was last seen in our office 12/03/2018, and he has a follow up visit 06/10/19 with Dr.Rehman.  I advised his wife ,Eustaquio Maize, that this would need to be addressed with Dr.Rehman.  Patient did not communicate with our office during those days that he was sick with the diarrhea. Therefore we cannot do a letter for those days.  However we can do a note for the patient stating that he will need to go to the bathroom when needed.  We will also ask that he get the paper work FMLA and this be completed so that he will have in place and coverage for these types of events.

## 2019-04-16 ENCOUNTER — Encounter (INDEPENDENT_AMBULATORY_CARE_PROVIDER_SITE_OTHER): Payer: Self-pay | Admitting: *Deleted

## 2019-04-17 ENCOUNTER — Other Ambulatory Visit (INDEPENDENT_AMBULATORY_CARE_PROVIDER_SITE_OTHER): Payer: Self-pay | Admitting: *Deleted

## 2019-04-17 DIAGNOSIS — K5080 Crohn's disease of both small and large intestine without complications: Secondary | ICD-10-CM

## 2019-04-18 LAB — C-REACTIVE PROTEIN: CRP: 3.1 mg/L (ref <8.0)

## 2019-06-10 ENCOUNTER — Other Ambulatory Visit: Payer: Self-pay

## 2019-06-10 ENCOUNTER — Encounter (INDEPENDENT_AMBULATORY_CARE_PROVIDER_SITE_OTHER): Payer: Self-pay | Admitting: Internal Medicine

## 2019-06-10 ENCOUNTER — Ambulatory Visit (INDEPENDENT_AMBULATORY_CARE_PROVIDER_SITE_OTHER): Payer: BC Managed Care – PPO | Admitting: Internal Medicine

## 2019-06-10 VITALS — BP 127/88 | HR 94 | Temp 98.6°F | Ht 76.0 in | Wt 244.9 lb

## 2019-06-10 DIAGNOSIS — K5080 Crohn's disease of both small and large intestine without complications: Secondary | ICD-10-CM

## 2019-06-10 DIAGNOSIS — K5010 Crohn's disease of large intestine without complications: Secondary | ICD-10-CM

## 2019-06-10 NOTE — Progress Notes (Addendum)
Presenting complaint;  Follow-up for Crohn's disease.  Database and subjective:  Patient is 44 year old Caucasian male who has history of small and large bowel Crohn's disease who is here for scheduled visit.  He was last seen 6 months ago. He had colonoscopy in July 2018 by Dr. Valentino Saxon in July 2018 and he had colitis involving 50 to 75% of the colorectal mucosa.  Biopsies were consistent with inflammatory bowel disease.  Abdominal CT 2 years ago revealed disease involving terminal ileum and colon.  He did not respond to oral mesalamine.  He has been on Humira/adalimumab since March 2019.  He also has history of iron deficiency anemia and required iron infusion in 2019.  He he feels fine.  He is having 2 formed stools every day.  He takes 4 mg of loperamide every morning.  He has not been constipated.  He may have diarrhea occasionally.  He denies rectal bleeding discharge or melena.  He does not have nocturnal bowel movement.  He is watching his diet.  He exercises regularly and has lost 5 pounds since his last visit.  He is hoping to lose more.  He is not having any side effects with Humira/adalimumab.  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  He still does not have insurance through his new employer.  Current Medications: Outpatient Encounter Medications as of 06/10/2019  Medication Sig  . Adalimumab (HUMIRA) 40 MG/0.4ML PSKT Inject 40 mg into the skin every 14 (fourteen) days. Every 14 days.  Marland Kitchen amLODipine (NORVASC) 5 MG tablet Take 1 tablet (5 mg total) by mouth daily. (Patient taking differently: Take 10 mg daily by mouth. )  . loperamide (IMODIUM) 2 MG capsule Take 2 capsules (4 mg total) by mouth daily. Patient takes( two) 2 mg  Every morning @@ 4 am.   No facility-administered encounter medications on file as of 06/10/2019.     Objective: Blood pressure 127/88, pulse 94, temperature 98.6 F (37 C), temperature source Temporal, height 6' 4"  (1.93  m), weight 244 lb 14.4 oz (111.1 kg). Patient is alert and in no acute distress. Patient is wearing facial mask. Conjunctiva is pink. Sclera is nonicteric Oropharyngeal mucosa is normal. No neck masses or thyromegaly noted. Cardiac exam with regular rhythm normal S1 and S2. No murmur or gallop noted. Lungs are clear to auscultation. Abdomen is full but soft and nontender with organomegaly or masses. No LE edema or clubbing noted.  Labs/studies Results:  CBC Latest Ref Rng & Units 12/03/2018 06/07/2018 11/29/2017  WBC 3.8 - 10.8 Thousand/uL 6.3 7.2 6.9  Hemoglobin 13.2 - 17.1 g/dL 13.0(L) 14.1 13.3  Hematocrit 38.5 - 50.0 % 38.6 42.9 41.4  Platelets 140 - 400 Thousand/uL 302 290 259    CMP Latest Ref Rng & Units 06/07/2018 10/24/2017 06/21/2017  Glucose 65 - 139 mg/dL 96 97 119(H)  BUN 7 - 25 mg/dL 17 15 15   Creatinine 0.60 - 1.35 mg/dL 1.08 0.98 0.73  Sodium 135 - 146 mmol/L 142 143 136  Potassium 3.5 - 5.3 mmol/L 3.9 4.2 4.0  Chloride 98 - 110 mmol/L 106 108 102  CO2 20 - 32 mmol/L 28 27 25   Calcium 8.6 - 10.3 mg/dL 9.0 9.1 8.3(L)  Total Protein 6.1 - 8.1 g/dL 6.7 7.0 -  Total Bilirubin 0.2 - 1.2 mg/dL 0.5 0.5 -  Alkaline Phos 38 - 126 U/L - - -  AST 10 - 40 U/L 20 23 -  ALT 9 - 46  U/L 28 20 -    Hepatic Function Latest Ref Rng & Units 06/07/2018 10/24/2017 06/19/2017  Total Protein 6.1 - 8.1 g/dL 6.7 7.0 5.7(L)  Albumin 3.5 - 5.0 g/dL - - 2.5(L)  AST 10 - 40 U/L 20 23 12(L)  ALT 9 - 46 U/L 28 20 15(L)  Alk Phosphatase 38 - 126 U/L - - 53  Total Bilirubin 0.2 - 1.2 mg/dL 0.5 0.5 0.5    Lab Results  Component Value Date   CRP 3.1 04/17/2019      Assessment:  #1.  Crohn's disease of small and large bowel.  He was diagnosed about 2-1/2 years ago.  He appears to be in clinical remission.  He is on loperamide and now having normal stools.  Hopefully he can eventually come off the loperamide or use it on as-needed basis.  #2.  History of iron deficiency anemia secondary to chronic GI  blood loss.  Hemoglobin was low normal 6 months ago.   Plan:  Patient will go to the lab for CBC, CMP and CRP. Will send new paperwork so that Humira/adalimumab could be renewed for another year. Office visit in 6 months.

## 2019-06-10 NOTE — Patient Instructions (Signed)
Physician will call with results of blood test when completed. Please call if you have persistent diarrhea or rectal bleeding.

## 2019-06-11 DIAGNOSIS — K5080 Crohn's disease of both small and large intestine without complications: Secondary | ICD-10-CM | POA: Insufficient documentation

## 2019-12-09 ENCOUNTER — Ambulatory Visit (INDEPENDENT_AMBULATORY_CARE_PROVIDER_SITE_OTHER): Payer: 59 | Admitting: Internal Medicine

## 2019-12-09 ENCOUNTER — Other Ambulatory Visit: Payer: Self-pay

## 2019-12-09 ENCOUNTER — Encounter (INDEPENDENT_AMBULATORY_CARE_PROVIDER_SITE_OTHER): Payer: Self-pay | Admitting: Internal Medicine

## 2019-12-09 VITALS — BP 133/93 | HR 87 | Temp 98.3°F | Ht 76.0 in | Wt 229.3 lb

## 2019-12-09 DIAGNOSIS — K5080 Crohn's disease of both small and large intestine without complications: Secondary | ICD-10-CM | POA: Diagnosis not present

## 2019-12-09 DIAGNOSIS — R197 Diarrhea, unspecified: Secondary | ICD-10-CM

## 2019-12-09 MED ORDER — HYOSCYAMINE SULFATE 0.125 MG PO TBDP: 0.1250 mg | ORAL_TABLET | Freq: Three times a day (TID) | ORAL | 1 refills | Status: DC | PRN

## 2019-12-09 MED ORDER — MESALAMINE ER 0.375 G PO CP24: 375.0000 mg | ORAL_CAPSULE | Freq: Every day | ORAL | 5 refills | Status: DC

## 2019-12-09 NOTE — Progress Notes (Signed)
Presenting complaint;  Diarrhea and weight loss. History of Crohn's disease involving terminal ileum and colon.  Database and subjective:  Patient is 44 year old Caucasian male who is here for scheduled visit.  He was last seen 6 months ago. He was diagnosed with Crohn's colitis back in July 2018.  He was treated with prednisone and begun on oral mesalamine.  He was admitted to [facility] in February 2019 for diarrhea and rectal bleeding and CT revealed wall thickening to terminal ileum as well as colon.  Once again he was treated with prednisone and antibiotics and improved.  He was also given 2 infusions of iron for iron deficiency anemia. Patient was begun on Humira in March 2020.  He was also maintained on oral mesalamine.  On his last visit 6 months ago he was doing well.  He was having 2-3 stools daily.  For stool was always formed.  He was not passing any blood.  Lab studies are unremarkable.  It was decided to discontinue Apriso and maintain him on Humira/adalimumab. Patient says he was fine until he ran out of Humira/adalimumab and had to wait 5 weeks before he did could get his prescription filled.  During that time.  He began to have diarrhea and noted small amount of blood with his bowel movement.  He went back on biologic but his symptoms have persisted.  He is having 4-5 bowel movements daily.  All his stools are loose.  He remains with hematochezia.  He is also having nocturnal diarrhea at least once or twice every night.  He is taking 4 mg of Imodium every morning.  He is also having an accident at least once a week.  He takes ibuprofen no more than twice a month. He has lost 15 pounds since his last visit.  He states his appetite is good but food just passes through it.  He also has noted excessive flatus and notices oily stool.  He wonders if he also has pancreatic insufficiency.  No history of pancreatitis in the past and no history of pancreatic disease. He denies fever  chills or night sweats.  At least 3 times a week he eats at a restaurant.  Current Medications: Outpatient Encounter Medications as of 12/09/2019  Medication Sig  . Adalimumab (HUMIRA) 40 MG/0.4ML PSKT Inject 40 mg into the skin every 14 (fourteen) days. Every 14 days.  Marland Kitchen amLODipine (NORVASC) 5 MG tablet Take 1 tablet (5 mg total) by mouth daily. (Patient taking differently: Take 10 mg daily by mouth. )  . bismuth subsalicylate (PEPTO BISMOL) 262 MG chewable tablet Chew 524 mg by mouth as needed.  . loperamide (IMODIUM) 2 MG capsule Take 2 capsules (4 mg total) by mouth daily. Patient takes( two) 2 mg  Every morning @@ 4 am.  . [DISCONTINUED] ibuprofen (ADVIL) 200 MG tablet Take 200 mg by mouth as needed. Mat take 1-2 per month.  .    .     No facility-administered encounter medications on file as of 12/09/2019.     Objective: Blood pressure (!) 133/93, pulse 87, temperature 98.3 F (36.8 C), temperature source Oral, height 6' 4"  (1.93 m), weight 229 lb 4.8 oz (104 kg). Patient is alert and in no acute distress. Patient is wearing a mask. Conjunctiva is pink. Sclera is nonicteric Oropharyngeal mucosa is normal. No neck masses or thyromegaly noted. Cardiac exam with regular rhythm normal S1 and S2. No murmur or gallop noted. Lungs are clear to auscultation. Abdomen is full.  Bowel sounds are normal.  On palpation abdomen is soft and nontender with organomegaly or masses. No LE edema or clubbing noted.  Labs/studies Results:  CBC Latest Ref Rng & Units 12/03/2018 06/07/2018 11/29/2017  WBC 3.8 - 10.8 Thousand/uL 6.3 7.2 6.9  Hemoglobin 13.2 - 17.1 g/dL 13.0(L) 14.1 13.3  Hematocrit 38 - 50 % 38.6 42.9 41.4  Platelets 140 - 400 Thousand/uL 302 290 259    CMP Latest Ref Rng & Units 06/07/2018 10/24/2017 06/21/2017  Glucose 65 - 139 mg/dL 96 97 119(H)  BUN 7 - 25 mg/dL 17 15 15   Creatinine 0.60 - 1.35 mg/dL 1.08 0.98 0.73  Sodium 135 - 146 mmol/L 142 143 136  Potassium 3.5 - 5.3 mmol/L 3.9  4.2 4.0  Chloride 98 - 110 mmol/L 106 108 102  CO2 20 - 32 mmol/L 28 27 25   Calcium 8.6 - 10.3 mg/dL 9.0 9.1 8.3(L)  Total Protein 6.1 - 8.1 g/dL 6.7 7.0 -  Total Bilirubin 0.2 - 1.2 mg/dL 0.5 0.5 -  Alkaline Phos 38 - 126 U/L - - -  AST 10 - 40 U/L 20 23 -  ALT 9 - 46 U/L 28 20 -    Hepatic Function Latest Ref Rng & Units 06/07/2018 10/24/2017 06/19/2017  Total Protein 6.1 - 8.1 g/dL 6.7 7.0 5.7(L)  Albumin 3.5 - 5.0 g/dL - - 2.5(L)  AST 10 - 40 U/L 20 23 12(L)  ALT 9 - 46 U/L 28 20 15(L)  Alk Phosphatase 38 - 126 U/L - - 53  Total Bilirubin 0.2 - 1.2 mg/dL 0.5 0.5 0.5    Lab Results  Component Value Date   CRP 3.1 04/17/2019     Assessment:  #1.  Crohn's disease involving terminal ileum and colon.  Patient experiencing diarrhea and hematochezia with weight loss.  His symptoms are suggestive of a relapse which possibly resulting from 5-week gap in Humira/adalimumab.  Need to rule out infection. It remains to be seen if stopping mesalamine at the time of his last visit 6 months ago is the reason for further symptoms.  #2.  Diarrhea and weight loss most likely due to Crohn's disease.  Will screen for pancreatic insufficiency as he is passing oily stools.   Plan:  Patient advised not to take ibuprofen or other or NSAIDs. He can take Tylenol up to 2 g/day in divided dose on as-needed basis. CBC, comprehensive chemistry panel and CRP. GI pathogen panel. Fecal pancreatic elastase. Hyoscyamine sublingual 1 tablet 3 times daily as needed. Resume Apriso at a dose of 1500 mg by mouth daily. Further recommendations to follow. Office visit in 3 months.

## 2019-12-09 NOTE — Patient Instructions (Signed)
Use hyoscyamine sulfate sublingual 1 tablet up to 3 times a day as needed for abdominal pain or cramping. Resume Apriso at prior dose of 1500 mg by mouth daily. Physician will call with results of blood test stool studies and further recommendations.

## 2019-12-10 LAB — COMPREHENSIVE METABOLIC PANEL
AG Ratio: 1.6 (calc) (ref 1.0–2.5)
ALT: 12 U/L (ref 9–46)
AST: 16 U/L (ref 10–40)
Albumin: 4.1 g/dL (ref 3.6–5.1)
Alkaline phosphatase (APISO): 75 U/L (ref 36–130)
BUN: 15 mg/dL (ref 7–25)
CO2: 29 mmol/L (ref 20–32)
Calcium: 8.8 mg/dL (ref 8.6–10.3)
Chloride: 107 mmol/L (ref 98–110)
Creat: 1.13 mg/dL (ref 0.60–1.35)
Globulin: 2.6 g/dL (calc) (ref 1.9–3.7)
Glucose, Bld: 88 mg/dL (ref 65–99)
Potassium: 4.2 mmol/L (ref 3.5–5.3)
Sodium: 141 mmol/L (ref 135–146)
Total Bilirubin: 0.6 mg/dL (ref 0.2–1.2)
Total Protein: 6.7 g/dL (ref 6.1–8.1)

## 2019-12-10 LAB — CBC
HCT: 46.4 % (ref 38.5–50.0)
Hemoglobin: 15 g/dL (ref 13.2–17.1)
MCH: 27.8 pg (ref 27.0–33.0)
MCHC: 32.3 g/dL (ref 32.0–36.0)
MCV: 85.9 fL (ref 80.0–100.0)
MPV: 10.7 fL (ref 7.5–12.5)
Platelets: 289 10*3/uL (ref 140–400)
RBC: 5.4 10*6/uL (ref 4.20–5.80)
RDW: 12.9 % (ref 11.0–15.0)
WBC: 6.5 10*3/uL (ref 3.8–10.8)

## 2019-12-10 LAB — C-REACTIVE PROTEIN: CRP: 7.5 mg/L (ref <8.0)

## 2020-02-10 ENCOUNTER — Encounter (INDEPENDENT_AMBULATORY_CARE_PROVIDER_SITE_OTHER): Payer: Self-pay | Admitting: Internal Medicine

## 2020-02-10 ENCOUNTER — Other Ambulatory Visit: Payer: Self-pay

## 2020-02-10 ENCOUNTER — Ambulatory Visit (INDEPENDENT_AMBULATORY_CARE_PROVIDER_SITE_OTHER): Payer: 59 | Admitting: Internal Medicine

## 2020-02-10 VITALS — BP 129/90 | HR 84 | Temp 98.5°F | Ht 76.0 in | Wt 233.3 lb

## 2020-02-10 DIAGNOSIS — K580 Irritable bowel syndrome with diarrhea: Secondary | ICD-10-CM

## 2020-02-10 DIAGNOSIS — K5080 Crohn's disease of both small and large intestine without complications: Secondary | ICD-10-CM | POA: Diagnosis not present

## 2020-02-10 DIAGNOSIS — K589 Irritable bowel syndrome without diarrhea: Secondary | ICD-10-CM | POA: Insufficient documentation

## 2020-02-10 MED ORDER — HYOSCYAMINE SULFATE 0.125 MG PO TBDP: 0.1250 mg | ORAL_TABLET | Freq: Three times a day (TID) | ORAL | 1 refills | Status: DC | PRN

## 2020-02-10 NOTE — Patient Instructions (Signed)
Notify if diarrhea recurs or you have rectal bleeding.

## 2020-02-10 NOTE — Progress Notes (Signed)
Presenting complaint;  Follow-up for Crohn's disease and diarrhea.  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 As he was doing well oral mesalamine was discontinued. He was last seen 3 months ago when he was complaining of loose stools. He complained of having 4-5 loose stools daily.  He also complained of excessive flatulence and passing oily stools. He also noted small amount of blood with his bowel movements. He had lost 15 pounds. Lab studies were unremarkable as reviewed below. I recommended stool studies consisting of GI pathogen panel and pancreatic elastase. Patient was begun on hyoscyamine sublingual.  He states he is much better. Since his stools are back to normal he did not do the stool studies. He has gained 4 pounds back. He is not having 2 formed stools daily. On most days he takes 1 sublingual hyoscyamine in the morning and second dose only when he travels. He is not having any side effects. His appetite is good. He denies abdominal pain melena or rectal bleeding.   Current Medications: Outpatient Encounter Medications as of 02/10/2020  Medication Sig  . Adalimumab (HUMIRA) 40 MG/0.4ML PSKT Inject 40 mg into the skin every 14 (fourteen) days. Every 14 days.  Marland Kitchen amLODipine (NORVASC) 5 MG tablet Take 1 tablet (5 mg total) by mouth daily. (Patient taking differently: Take 10 mg daily by mouth. )  . hyoscyamine (ANASPAZ) 0.125 MG TBDP disintergrating tablet Place 1 tablet (0.125 mg total) under the tongue 3 (three) times daily as needed.  . mesalamine (APRISO) 0.375 g 24 hr capsule Take 1 capsule (0.375 g total) by mouth daily.  Marland Kitchen loperamide (IMODIUM) 2 MG capsule Take 2 capsules (4 mg total) by mouth daily. Patient takes( two) 2 mg  Every morning @@ 4 am. (Patient not taking: Reported on 02/10/2020)  . [DISCONTINUED] bismuth  subsalicylate (PEPTO BISMOL) 262 MG chewable tablet Chew 524 mg by mouth as needed. (Patient not taking: Reported on 02/10/2020)   No facility-administered encounter medications on file as of 02/10/2020.     Objective: Blood pressure 129/90, pulse 84, temperature 98.5 F (36.9 C), temperature source Oral, height 6' 4" (1.93 m), weight 233 lb 4.8 oz (105.8 kg). Patient is alert and in no acute distress. He is wearing a mask. Conjunctiva is pink. Sclera is nonicteric Oropharyngeal mucosa is normal. No neck masses or thyromegaly noted. Cardiac exam with regular rhythm normal S1 and S2. No murmur or gallop noted. Lungs are clear to auscultation. Abdomen is full. Bowel sounds are normal. On palpation abdomen is soft and nontender without organomegaly or masses. No LE edema or clubbing noted.  Labs/studies Results:  CBC Latest Ref Rng & Units 12/09/2019 12/03/2018 06/07/2018  WBC 3.8 - 10.8 Thousand/uL 6.5 6.3 7.2  Hemoglobin 13.2 - 17.1 g/dL 15.0 13.0(L) 14.1  Hematocrit 38 - 50 % 46.4 38.6 42.9  Platelets 140 - 400 Thousand/uL 289 302 290    CMP Latest Ref Rng & Units 12/09/2019 06/07/2018 10/24/2017  Glucose 65 - 99 mg/dL 88 96 97  BUN 7 - 25 mg/dL _0 Creatinine 0.60 - 1.35 mg/dL 1.13 1.08 0.98  Sodium 135 - 146 mmol/L 141 142 143  Potassium 3.5 - 5.3 mmol/L 4.2 3.9 4.2  Chloride 98 - 110 mmol/L 107 106 108  CO2 20 - 32 mmol/L _1 Calcium 8.6 - 10.3 mg/dL 8.8 9.0 9.1  Total Protein 6.1 - 8.1 g/dL 6.7 6.7 7.0  Total Bilirubin 0.2 - 1.2 mg/dL 0.6 0.5 0.5  Alkaline Phos 38 - 126 U/L - - -  AST 10 - 40 U/L 16 20 23  ALT 9 - 46 U/L 12 28 20    Hepatic Function Latest Ref Rng & Units 12/09/2019 06/07/2018 10/24/2017  Total Protein 6.1 - 8.1 g/dL 6.7 6.7 7.0  Albumin 3.5 - 5.0 g/dL - - -  AST 10 - 40 U/L 16 20 23  ALT 9 - 46 U/L 12 28 20  Alk Phosphatase 38 - 126 U/L - - -  Total Bilirubin 0.2 - 1.2 mg/dL 0.6 0.5 0.5    Lab Results  Component Value Date   CRP 7.5 12/09/2019       Assessment:  #1. Crohn's disease. He has ileocolonic Crohn's disease which was diagnosed in July 2018 when he presented with iron deficiency anemia and GI bleed and received iron infusion. He appears to be back in remission. He is now on Humira/adalimumab and oral mesalamine.  #2. Diarrhea. Stool studies are planned on his last visit 3 months. However diarrhea resolved with low-dose antispasmodic. Therefore stool studies not done. Response to antispasmodic would suggest he may have an underlying irritable bowel syndrome.   Plan:  Patient will continue Humira/adalimumab and oral mesalamine as before. He will continue use hyoscyamine sublingual 1 tablet up to 3 times a day. Another prescription sent to his pharmacy for 1 month with 1 refill. He is to have blood work in December at the time of his physical exam. Office visit in 6 months.     

## 2020-02-12 ENCOUNTER — Ambulatory Visit (INDEPENDENT_AMBULATORY_CARE_PROVIDER_SITE_OTHER): Payer: 59 | Admitting: Internal Medicine

## 2020-06-11 ENCOUNTER — Telehealth (INDEPENDENT_AMBULATORY_CARE_PROVIDER_SITE_OTHER): Payer: Self-pay | Admitting: *Deleted

## 2020-06-11 NOTE — Telephone Encounter (Signed)
Patient had called on the 7 th of February, to say that the pharmacy needed a refill for his Humira. On the 7th of February I called [facility] [place] and gave a verbal order for Humira 40 mg/0.20m patient is to inject SQ every other week #2 11 refills. I spoke with TSherren Mochathe pharmacist at the [facility], he shared that he was going to forward this to the specialty pharmacy and they would call the patient for delivery.  This morning I left a voicemail for Deitrich to follow up to make sure that there was no issues. I also called and reaffirmed everything with [facility] [place] that he had resubmitted the request to the [facility] and they should be calling Syd soon.

## 2020-08-03 ENCOUNTER — Encounter (INDEPENDENT_AMBULATORY_CARE_PROVIDER_SITE_OTHER): Payer: Self-pay | Admitting: Internal Medicine

## 2020-08-03 ENCOUNTER — Other Ambulatory Visit: Payer: Self-pay

## 2020-08-03 ENCOUNTER — Ambulatory Visit (INDEPENDENT_AMBULATORY_CARE_PROVIDER_SITE_OTHER): Payer: 59 | Admitting: Internal Medicine

## 2020-08-03 VITALS — BP 143/90 | HR 93 | Temp 97.8°F | Ht 76.0 in | Wt 227.1 lb

## 2020-08-03 DIAGNOSIS — K5080 Crohn's disease of both small and large intestine without complications: Secondary | ICD-10-CM

## 2020-08-03 DIAGNOSIS — K580 Irritable bowel syndrome with diarrhea: Secondary | ICD-10-CM | POA: Diagnosis not present

## 2020-08-03 MED ORDER — DICYCLOMINE HCL 10 MG PO CAPS: 10.0000 mg | ORAL_CAPSULE | Freq: Three times a day (TID) | ORAL | 2 refills | Status: DC | PRN

## 2020-08-03 MED ORDER — PREDNISONE 10 MG PO TABS: 40.0000 mg | ORAL_TABLET | Freq: Every day | ORAL | 0 refills | Status: DC

## 2020-08-03 NOTE — Progress Notes (Signed)
Presenting complaint;  Follow-up for Crohn's disease.  Database and subjective:  Patient is 45 year old Caucasian male who has history of ileocolonic Crohn's disease diagnosed in 2018 who has been maintained on Humira/adalimumab and oral mesalamine.  He was last seen on 02/10/2020 and was doing well.  His diarrhea had responded to low-dose antispasmodic. He states he is not doing well.  He has been passing blood per rectum for the last 2 weeks.  He also has pain across his lower and mid abdomen.  Bleeding is occurring just about every time he is a bowel movement and he is having 5-6 stools daily.  All of his stools are loose.  He is having frequent accidents.  He is not able to make it to the bathroom.  He denies nausea vomiting fever or chills.  Last Humira dose was over 2 months ago.  His current insurance refused to pay for this medication.  He has lost 4 pounds since his last visit.  He is not taking any NSAIDs. Patient says he had an option of health insurance through his employer but is co-pay was very high any was not able to afford it.  He will be on wife's plan as of 29 Aug 2020.  Current Medications: Outpatient Encounter Medications as of 08/03/2020  Medication Sig  . amLODipine (NORVASC) 5 MG tablet Take 1 tablet (5 mg total) by mouth daily. (Patient taking differently: Take 10 mg by mouth daily.)  . hyoscyamine (ANASPAZ) 0.125 MG TBDP disintergrating tablet Place 1 tablet (0.125 mg total) under the tongue 3 (three) times daily as needed.  . loperamide (IMODIUM) 2 MG capsule Take 2 capsules (4 mg total) by mouth daily. Patient takes( two) 2 mg  Every morning @@ 4 am. (Patient taking differently: Take 4 mg by mouth daily. Patient takes( two) 2 mg  Every morning @@ 4 am.)  . mesalamine (APRISO) 0.375 g 24 hr capsule Take 1 capsule (0.375 g total) by mouth daily.  . Adalimumab (HUMIRA) 40 MG/0.4ML PSKT Inject 40 mg into the skin every 14 (fourteen) days. Every 14 days. (Patient not taking:  Reported on 08/03/2020)   No facility-administered encounter medications on file as of 08/03/2020.    Objective: Blood pressure (!) 143/90, pulse 93, temperature 97.8 F (36.6 C), temperature source Oral, height 6' 4"  (1.93 m), weight 227 lb 1.6 oz (103 kg). Patient is alert and in no acute distress. Conjunctiva is pink. Sclera is nonicteric Oropharyngeal mucosa is normal. No neck masses or thyromegaly noted. Cardiac exam with regular rhythm normal S1 and S2. No murmur or gallop noted. Lungs are clear to auscultation. Abdomen;  No LE edema or clubbing noted.  Labs/studies Results:  CBC Latest Ref Rng & Units 12/09/2019 12/03/2018 06/07/2018  WBC 3.8 - 10.8 Thousand/uL 6.5 6.3 7.2  Hemoglobin 13.2 - 17.1 g/dL 15.0 13.0(L) 14.1  Hematocrit 38.5 - 50.0 % 46.4 38.6 42.9  Platelets 140 - 400 Thousand/uL 289 302 290    CMP Latest Ref Rng & Units 12/09/2019 06/07/2018 10/24/2017  Glucose 65 - 99 mg/dL 88 96 97  BUN 7 - 25 mg/dL 15 17 15   Creatinine 0.60 - 1.35 mg/dL 1.13 1.08 0.98  Sodium 135 - 146 mmol/L 141 142 143  Potassium 3.5 - 5.3 mmol/L 4.2 3.9 4.2  Chloride 98 - 110 mmol/L 107 106 108  CO2 20 - 32 mmol/L 29 28 27   Calcium 8.6 - 10.3 mg/dL 8.8 9.0 9.1  Total Protein 6.1 - 8.1 g/dL 6.7 6.7 7.0  Total Bilirubin 0.2 - 1.2 mg/dL 0.6 0.5 0.5  Alkaline Phos 38 - 126 U/L - - -  AST 10 - 40 U/L 16 20 23   ALT 9 - 46 U/L 12 28 20     Hepatic Function Latest Ref Rng & Units 12/09/2019 06/07/2018 10/24/2017  Total Protein 6.1 - 8.1 g/dL 6.7 6.7 7.0  Albumin 3.5 - 5.0 g/dL - - -  AST 10 - 40 U/L 16 20 23   ALT 9 - 46 U/L 12 28 20   Alk Phosphatase 38 - 126 U/L - - -  Total Bilirubin 0.2 - 1.2 mg/dL 0.6 0.5 0.5    Lab Results  Component Value Date   CRP 7.5 12/09/2019    Most recent lab data is from August 2021  Assessment:  #1.  Ileocolonic Crohn's disease.  Patient is back to his baseline off Humira/adalimumab.  Last dose was over 2 months ago.  He does not appear to be toxic or dehydrated.   Will bridge her with prednisone and figure out how to get biologic for him until he can get on his wife's insurance.   Plan:  Begin prednisone 30 mg by mouth daily with breakfast. Drop dose by 5 mg every week. Dicyclomine 10 mg before each meal.  After 2 weeks can use it on as-needed basis. Will send prescription for Humira/adalimumab as soon as patient's health insurance activated. Office visit in 3 months.

## 2020-08-03 NOTE — Patient Instructions (Signed)
Decrease Prednisone dose by 5 mg every week. Please call with progress report next week. Take dicyclomine 30 minutes before each meal. After two weeks can use on as needed basis

## 2020-08-04 ENCOUNTER — Telehealth (INDEPENDENT_AMBULATORY_CARE_PROVIDER_SITE_OTHER): Payer: Self-pay

## 2020-08-04 ENCOUNTER — Other Ambulatory Visit (INDEPENDENT_AMBULATORY_CARE_PROVIDER_SITE_OTHER): Payer: Self-pay | Admitting: Internal Medicine

## 2020-08-04 MED ORDER — HUMIRA (2 SYRINGE) 40 MG/0.4ML ~~LOC~~ PSKT: 40.0000 mg | PREFILLED_SYRINGE | SUBCUTANEOUS | 5 refills | Status: DC

## 2020-08-04 NOTE — Telephone Encounter (Signed)
Medication sent to [facility] by Dr. Laural Golden. Patient is aware.

## 2020-08-04 NOTE — Telephone Encounter (Signed)
I tried calling the patient to see if he is already on Humira, no answer.  Patient called and states he det him self up for a five dollar savings card on his Humira and is ready for Dr. Laural Golden to send this in to [facility].

## 2020-08-04 NOTE — Progress Notes (Signed)
Humira script sent to patients pharmacy

## 2020-08-09 ENCOUNTER — Other Ambulatory Visit (INDEPENDENT_AMBULATORY_CARE_PROVIDER_SITE_OTHER): Payer: Self-pay

## 2020-08-09 MED ORDER — HUMIRA (2 SYRINGE) 40 MG/0.4ML ~~LOC~~ PSKT: 40.0000 mg | PREFILLED_SYRINGE | SUBCUTANEOUS | 5 refills | Status: DC

## 2020-08-10 ENCOUNTER — Ambulatory Visit (INDEPENDENT_AMBULATORY_CARE_PROVIDER_SITE_OTHER): Payer: 59 | Admitting: Internal Medicine

## 2020-09-01 ENCOUNTER — Telehealth (INDEPENDENT_AMBULATORY_CARE_PROVIDER_SITE_OTHER): Payer: Self-pay | Admitting: Internal Medicine

## 2020-09-01 NOTE — Telephone Encounter (Signed)
Will discuss with Dr. Laural Golden 09/02/2020.

## 2020-09-01 NOTE — Telephone Encounter (Signed)
Patient called stated he needs a refill on Apriso - send to [facility]

## 2020-09-02 ENCOUNTER — Other Ambulatory Visit (INDEPENDENT_AMBULATORY_CARE_PROVIDER_SITE_OTHER): Payer: Self-pay | Admitting: Internal Medicine

## 2020-09-02 MED ORDER — MESALAMINE ER 0.375 G PO CP24: 375.0000 mg | ORAL_CAPSULE | Freq: Every day | ORAL | 5 refills | Status: DC

## 2020-09-02 NOTE — Telephone Encounter (Signed)
I called and left a vm  that the medication has been sent to the requested pharmacy.

## 2020-09-06 ENCOUNTER — Other Ambulatory Visit (INDEPENDENT_AMBULATORY_CARE_PROVIDER_SITE_OTHER): Payer: Self-pay | Admitting: Gastroenterology

## 2020-09-06 ENCOUNTER — Telehealth (INDEPENDENT_AMBULATORY_CARE_PROVIDER_SITE_OTHER): Payer: Self-pay

## 2020-09-06 MED ORDER — PREDNISONE 10 MG PO TABS: 40.0000 mg | ORAL_TABLET | Freq: Every day | ORAL | 0 refills | Status: DC

## 2020-09-06 NOTE — Telephone Encounter (Signed)
Patient recently started his Humira, he was taking prednisone 40 mg but has no more pills at this moment.  I will refill a week of this medication but he may need to have a taper.  I will defer the next step in management to his primary gastroenterologist Dr. Laural Golden.

## 2020-09-06 NOTE — Telephone Encounter (Signed)
Patient called stating he was given a steroid and after finishing it up on Thursday he started having diarrhea again on Saturday. Please advise.

## 2020-09-06 NOTE — Telephone Encounter (Signed)
Patient aware Dr. Jenetta Downer has sent in some prednisone for him and that we will discuss further with Dr. Laural Golden on 09/07/2020.

## 2020-09-06 NOTE — Telephone Encounter (Signed)
I spoke with the patient he states he has been taking prednisone 10 mg 4 tablets per day. He finished it on Thursday and started having diarrhea again on Saturday. He reports to have seen some bright red blood in stools this am. He state she has no nausea or vomiting, no abdominal pains, but does have some rumbling in stomach. He does not have a fever. He was last seen by Dr. Laural Golden on 08/03/2020 for Crohns. Please advise.

## 2020-09-07 ENCOUNTER — Other Ambulatory Visit (INDEPENDENT_AMBULATORY_CARE_PROVIDER_SITE_OTHER): Payer: Self-pay | Admitting: Internal Medicine

## 2020-09-07 MED ORDER — PREDNISONE 10 MG PO TABS: 30.0000 mg | ORAL_TABLET | Freq: Every day | ORAL | 0 refills | Status: DC

## 2020-09-07 NOTE — Telephone Encounter (Signed)
Patient aware of all.   Per Dr. Laural Golden patient will need to take the Prednisone 40 mg for 7 days, Then 30 mg for 7 days Then 25 mg for 7 days Then 20 mg for 7 days Then 15 mg for 7 days Then 10 mg for 7 days Then  5 mg for 7 days Then Stop.

## 2020-09-13 NOTE — Telephone Encounter (Signed)
Patient called the office stated he is still having diarrhea even though he is taking steroids - please advise - ph# [phone]

## 2020-09-14 ENCOUNTER — Telehealth (INDEPENDENT_AMBULATORY_CARE_PROVIDER_SITE_OTHER): Payer: Self-pay

## 2020-09-14 NOTE — Telephone Encounter (Signed)
I called and left a message asked that he please return call.

## 2020-09-14 NOTE — Telephone Encounter (Signed)
Mitzie please move appointment per Dr. Laural Golden to next month. Thanks  Patient is aware of all.   #1 : Per Dr. Laural Golden continue the prednisone 40 mg for another week.  #2 : Change appointment to next month.  #3 : Call next week with an update.

## 2020-09-14 NOTE — Telephone Encounter (Signed)
Patient called today stating he is still having issues with rectal bleeding (dark in color red), this has been ongoing for a month and a half. He states he is unable to control bowels. He states he has been to the restroom three times with diarrhea (diarrhea ongoing for two weeks. this morning. He states he has mid abdominal pain, and unable to hold stools at times. He is also bloated. He has no fever, no nausea, no vomiting. He is on Humira and taking Prednisone 40 mg daily and will step down tomorrow to 30 mg .

## 2020-09-15 ENCOUNTER — Telehealth (INDEPENDENT_AMBULATORY_CARE_PROVIDER_SITE_OTHER): Payer: Self-pay | Admitting: *Deleted

## 2020-09-15 NOTE — Telephone Encounter (Signed)
Returning Crystal's call - please call [phone]

## 2020-09-15 NOTE — Telephone Encounter (Signed)
Patient called and wants Dr. Laural Golden write him out of work for the rest of the week. I advised that Dr. Laural Golden would be back in the office tomorrow and I would discuss with him at that time.

## 2020-09-17 ENCOUNTER — Telehealth (INDEPENDENT_AMBULATORY_CARE_PROVIDER_SITE_OTHER): Payer: Self-pay | Admitting: *Deleted

## 2020-09-17 NOTE — Telephone Encounter (Signed)
Patient is requesting a work note fir May 16-20. Addressed with Dr. Laural Golden and he said that patient could be given as note for this week.

## 2020-09-20 ENCOUNTER — Encounter (INDEPENDENT_AMBULATORY_CARE_PROVIDER_SITE_OTHER): Payer: Self-pay | Admitting: *Deleted

## 2020-09-20 NOTE — Telephone Encounter (Signed)
Work note has been completed, patient was made aware.

## 2020-09-20 NOTE — Telephone Encounter (Signed)
Letter printed and up front, patient aware to stop by and pick up

## 2020-10-12 ENCOUNTER — Encounter (INDEPENDENT_AMBULATORY_CARE_PROVIDER_SITE_OTHER): Payer: Self-pay | Admitting: Internal Medicine

## 2020-10-12 ENCOUNTER — Ambulatory Visit (INDEPENDENT_AMBULATORY_CARE_PROVIDER_SITE_OTHER): Payer: 59 | Admitting: Internal Medicine

## 2020-10-12 ENCOUNTER — Other Ambulatory Visit: Payer: Self-pay

## 2020-10-12 ENCOUNTER — Other Ambulatory Visit (INDEPENDENT_AMBULATORY_CARE_PROVIDER_SITE_OTHER): Payer: Self-pay

## 2020-10-12 VITALS — BP 138/82 | HR 81 | Temp 97.9°F | Ht 76.0 in | Wt 241.0 lb

## 2020-10-12 DIAGNOSIS — D500 Iron deficiency anemia secondary to blood loss (chronic): Secondary | ICD-10-CM

## 2020-10-12 DIAGNOSIS — K5080 Crohn's disease of both small and large intestine without complications: Secondary | ICD-10-CM | POA: Diagnosis not present

## 2020-10-12 MED ORDER — HUMIRA (2 SYRINGE) 40 MG/0.4ML ~~LOC~~ PSKT: 40.0000 mg | PREFILLED_SYRINGE | SUBCUTANEOUS | 3 refills | Status: DC

## 2020-10-12 MED ORDER — PREDNISONE 10 MG PO TABS: 50.0000 mg | ORAL_TABLET | Freq: Every day | ORAL | 0 refills | Status: DC

## 2020-10-12 MED ORDER — FLINTSTONES COMPLETE 18 MG PO CHEW: 1.0000 | CHEWABLE_TABLET | Freq: Two times a day (BID) | ORAL | Status: DC

## 2020-10-12 NOTE — Patient Instructions (Signed)
Prednisone schedule as follows 50 mg daily for one week 40 mg daily for one week 30 mg daily for one week 25 mg daily for one week 20 mg daily for one week 15 mg daily for one week 10 mg daily for one week 5 mg daily for one week and stop

## 2020-10-12 NOTE — Progress Notes (Signed)
Presenting complaint;  Follow-up for Crohn's disease.  Database and subjective:  Patient is 45 year old Caucasian male who was history of ileocolonic Crohn's disease which was diagnosed when he had colonoscopy by Dr. Anthony Sar in July 2018.  He had been doing well until he lost his insurance(he was in his wife's insurance who lost her bank job) and his symptoms relapse. He was begun on prednisone on 08/03/2020.  In the meantime he has been able to get back on his wife's new insurance and finally was able to fill Humira prescription.  As prednisone dose was tapered his diarrhea relapsed.  Prednisone dose was increased again.  He has noted improvement as of yesterday.  He is back on Humira.  He has had what appears to be 3 doses.  He was not given induction therapy this time.  He remains on Apriso and dicyclomine. Last week he had vasovagal presyncope while at work.  He was seen at the [facility] and his hemoglobin was 10.3.  He was also given steroid shot.  Prednisone dose was increased to 60 mg daily until he could be seen in our office.  He is on medical leave. He is having 5-6 stools per day.  Stools are predominately watery.  He is noticing blood with most of his bowel movements but as of yesterday the amount of blood bleeding has slowed.  His appetite is normal.  He has gained 14 pounds since his visit 10 weeks ago. He does not take OTC NSAIDs.  Current Medications: Outpatient Encounter Medications as of 10/12/2020  Medication Sig   Adalimumab (HUMIRA) 40 MG/0.4ML PSKT Inject 40 mg into the skin every 14 (fourteen) days.   amLODipine-valsartan (EXFORGE) 5-160 MG tablet Take 1 tablet by mouth daily.   cephALEXin (KEFLEX) 500 MG capsule Take 500 mg by mouth 4 (four) times daily.   dicyclomine (BENTYL) 10 MG capsule Take 1 capsule (10 mg total) by mouth 3 (three) times daily as needed for spasms.   hydrochlorothiazide (MICROZIDE) 12.5 MG capsule Take 12.5 mg by mouth daily.   hyoscyamine  (ANASPAZ) 0.125 MG TBDP disintergrating tablet Place 0.125 mg under the tongue in the morning and at bedtime.   loperamide (IMODIUM) 2 MG capsule Take 2 capsules (4 mg total) by mouth daily. Patient takes( two) 2 mg  Every morning @@ 4 am. (Patient taking differently: Take 4 mg by mouth daily. Patient takes( two) 2 mg  Every morning @@ 4 am.)   mesalamine (APRISO) 0.375 g 24 hr capsule Take 1 capsule (0.375 g total) by mouth daily.   predniSONE (DELTASONE) 10 MG tablet Take 3 tablets (30 mg total) by mouth daily with breakfast. (Patient taking differently: Take 20 mg by mouth daily with breakfast. Three daily)   [DISCONTINUED] amLODipine (NORVASC) 5 MG tablet Take 1 tablet (5 mg total) by mouth daily. (Patient taking differently: Take 10 mg by mouth daily.)   No facility-administered encounter medications on file as of 10/12/2020.     Objective: Blood pressure 138/82, pulse 81, temperature 97.9 F (36.6 C), temperature source Oral, height 6' 4"  (1.93 m), weight 241 lb (109.3 kg). Patient is alert and in no acute distress. He is wearing a mask. Conjunctiva is pink. Sclera is nonicteric Oropharyngeal mucosa is normal. No neck masses or thyromegaly noted. Cardiac exam with regular rhythm normal S1 and S2. No murmur or gallop noted. Lungs are clear to auscultation. Abdomen is full.  Bowel sounds are normal.  On palpation abdomen is soft.  He has  mild tenderness at LLQ and RLQ.  No guarding rebound organomegaly or masses. No LE edema or clubbing noted.  Labs/studies Results:   CBC Latest Ref Rng & Units 12/09/2019 12/03/2018 06/07/2018  WBC 3.8 - 10.8 Thousand/uL 6.5 6.3 7.2  Hemoglobin 13.2 - 17.1 g/dL 15.0 13.0(L) 14.1  Hematocrit 38.5 - 50.0 % 46.4 38.6 42.9  Platelets 140 - 400 Thousand/uL 289 302 290    CMP Latest Ref Rng & Units 12/09/2019 06/07/2018 10/24/2017  Glucose 65 - 99 mg/dL 88 96 97  BUN 7 - 25 mg/dL 15 17 15   Creatinine 0.60 - 1.35 mg/dL 1.13 1.08 0.98  Sodium 135 - 146 mmol/L  141 142 143  Potassium 3.5 - 5.3 mmol/L 4.2 3.9 4.2  Chloride 98 - 110 mmol/L 107 106 108  CO2 20 - 32 mmol/L 29 28 27   Calcium 8.6 - 10.3 mg/dL 8.8 9.0 9.1  Total Protein 6.1 - 8.1 g/dL 6.7 6.7 7.0  Total Bilirubin 0.2 - 1.2 mg/dL 0.6 0.5 0.5  Alkaline Phos 38 - 126 U/L - - -  AST 10 - 40 U/L 16 20 23   ALT 9 - 46 U/L 12 28 20     Hepatic Function Latest Ref Rng & Units 12/09/2019 06/07/2018 10/24/2017  Total Protein 6.1 - 8.1 g/dL 6.7 6.7 7.0  Albumin 3.5 - 5.0 g/dL - - -  AST 10 - 40 U/L 16 20 23   ALT 9 - 46 U/L 12 28 20   Alk Phosphatase 38 - 126 U/L - - -  Total Bilirubin 0.2 - 1.2 mg/dL 0.6 0.5 0.5    Lab Results  Component Value Date   CRP 7.5 12/09/2019    Hemoglobin was 10.3 on 10/07/2020  Assessment:  #1.  Ileocolonic Crohn's disease.  He was doing quite well with combination of oral mesalamine and Humira/adalimumab until he could not fill biologic anymore because his wife lost insurance.  He is now back on her plan.  He is only had 3 doses of Humira/adalimumab and he remains to be seen if he will have the same benefit or not.  He needs to stay on biologic for at least 16 weeks before considering drug and antibody levels and/or increasing dose frequency.  #2.  Abdominal pain.  He possibly has IBS in addition to IBD.  He is using dicyclomine and/or hyoscyamine on as-needed basis.  #23  Iron deficiency anemia secondary to chronic GI blood loss.  He is not taking p.o. iron.   Plan:  Patient provided with written schedule for prednisone taper. [place] chewable 1 tablet twice daily. He will go to lab for CBC and CRP on 10/18/2020. He should be able to return to work unless hemoglobin is dropped below 10 g. Office visit in 2 months.

## 2020-10-13 ENCOUNTER — Other Ambulatory Visit (INDEPENDENT_AMBULATORY_CARE_PROVIDER_SITE_OTHER): Payer: Self-pay

## 2020-10-13 DIAGNOSIS — K5010 Crohn's disease of large intestine without complications: Secondary | ICD-10-CM

## 2020-10-13 DIAGNOSIS — K5080 Crohn's disease of both small and large intestine without complications: Secondary | ICD-10-CM

## 2020-10-13 MED ORDER — HUMIRA (2 SYRINGE) 40 MG/0.4ML ~~LOC~~ PSKT: 40.0000 mg | PREFILLED_SYRINGE | SUBCUTANEOUS | 3 refills | Status: DC

## 2020-10-18 ENCOUNTER — Other Ambulatory Visit (INDEPENDENT_AMBULATORY_CARE_PROVIDER_SITE_OTHER): Payer: Self-pay | Admitting: Internal Medicine

## 2020-10-18 NOTE — Telephone Encounter (Signed)
Will address with Dr. Laural Golden.

## 2020-10-19 LAB — CBC
HCT: 38.9 % (ref 38.5–50.0)
Hemoglobin: 12.3 g/dL — ABNORMAL LOW (ref 13.2–17.1)
MCH: 25.9 pg — ABNORMAL LOW (ref 27.0–33.0)
MCHC: 31.6 g/dL — ABNORMAL LOW (ref 32.0–36.0)
MCV: 82.1 fL (ref 80.0–100.0)
MPV: 9.3 fL (ref 7.5–12.5)
Platelets: 424 10*3/uL — ABNORMAL HIGH (ref 140–400)
RBC: 4.74 10*6/uL (ref 4.20–5.80)
RDW: 14 % (ref 11.0–15.0)
WBC: 12.6 10*3/uL — ABNORMAL HIGH (ref 3.8–10.8)

## 2020-10-19 LAB — C-REACTIVE PROTEIN: CRP: 4.1 mg/L (ref <8.0)

## 2020-11-02 ENCOUNTER — Other Ambulatory Visit (INDEPENDENT_AMBULATORY_CARE_PROVIDER_SITE_OTHER): Payer: Self-pay

## 2020-11-02 ENCOUNTER — Telehealth (INDEPENDENT_AMBULATORY_CARE_PROVIDER_SITE_OTHER): Payer: Self-pay

## 2020-11-02 DIAGNOSIS — K5010 Crohn's disease of large intestine without complications: Secondary | ICD-10-CM

## 2020-11-02 NOTE — Telephone Encounter (Signed)
Patient called today stating he reduced the prednisone down to 30 mg on Sunday and is now today seeing bright red blood in stools again. He has no nausea,vomiting, diarrhea or constipation. Please advise.

## 2020-11-02 NOTE — Telephone Encounter (Signed)
I called and left a vm asked that the patient return call.   Orders placed for [facility]. Orders at the front desk for the patient to pick up.   Per Dr. Laural Golden have Adalimumab + Ab done one day before the next dose of Humira. Stay on the 30 mg of Prednisone for now.

## 2020-11-03 NOTE — Telephone Encounter (Signed)
I called and left a detail message asked that he please return call to confirm he is aware of all.

## 2020-11-03 NOTE — Telephone Encounter (Signed)
I spoke with the patient he states he got the message. He will come by office on Monday November 08, 2020 and get the order for the lab and have it drawn. He will then take his Humira on Tuesday November 09, 2020. He is aware to stay on the Prednisone 30 mg until further notice.

## 2020-11-18 LAB — ADALIMUMAB DRUG LEVEL AND ANTI-DRUG AB FOR RHEUMATIC DISEASE
ADALIMUMAB AB,RHEUM: <10 AU (ref <10)
ADALIMUMAB LEVEL, RHEUM: 4.8 ug/mL

## 2020-11-30 ENCOUNTER — Ambulatory Visit (INDEPENDENT_AMBULATORY_CARE_PROVIDER_SITE_OTHER): Payer: 59 | Admitting: Internal Medicine

## 2020-11-30 ENCOUNTER — Other Ambulatory Visit (INDEPENDENT_AMBULATORY_CARE_PROVIDER_SITE_OTHER): Payer: Self-pay

## 2020-11-30 DIAGNOSIS — D500 Iron deficiency anemia secondary to blood loss (chronic): Secondary | ICD-10-CM

## 2020-12-16 ENCOUNTER — Encounter (INDEPENDENT_AMBULATORY_CARE_PROVIDER_SITE_OTHER): Payer: Self-pay | Admitting: Internal Medicine

## 2020-12-16 ENCOUNTER — Other Ambulatory Visit: Payer: Self-pay

## 2020-12-16 ENCOUNTER — Ambulatory Visit (INDEPENDENT_AMBULATORY_CARE_PROVIDER_SITE_OTHER): Payer: 59 | Admitting: Internal Medicine

## 2020-12-16 VITALS — BP 138/81 | HR 99 | Temp 98.0°F | Ht 76.0 in | Wt 248.0 lb

## 2020-12-16 DIAGNOSIS — D500 Iron deficiency anemia secondary to blood loss (chronic): Secondary | ICD-10-CM

## 2020-12-16 DIAGNOSIS — K5080 Crohn's disease of both small and large intestine without complications: Secondary | ICD-10-CM | POA: Diagnosis not present

## 2020-12-16 MED ORDER — LOPERAMIDE HCL 2 MG PO CAPS: 2.0000 mg | ORAL_CAPSULE | Freq: Two times a day (BID) | ORAL | 5 refills | Status: DC | PRN

## 2020-12-16 MED ORDER — MESALAMINE ER 0.375 G PO CP24: 1500.0000 mg | ORAL_CAPSULE | Freq: Every day | ORAL | 5 refills | Status: DC

## 2020-12-16 NOTE — Patient Instructions (Signed)
Physician will call with results of CBC when completed. Fecal calprotectin in 3 months. Notify if symptoms relapse when he finished prednisone.

## 2020-12-16 NOTE — Progress Notes (Signed)
Presenting complaint;  Follow-up for Crohn's disease and anemia.  Database and subjective:  Patient is 45 year old Caucasian male with history of ileocolonic Crohn's disease who is here for scheduled visit. Last visit was on 10/12/2020. He was diagnosed with ileocolonic Crohn's disease in July 2018.  His colonoscopy was at [facility]. He also received iron infusion for iron deficiency anemia in 2019.  He had been doing well on Humira/adalimumab until his wife lost her job and he could not refill the medication anymore.  He was treated with prednisone starting on August 04, 2018.  He was maintained on oral mesalamine. He began to have abdominal cramping diarrhea and rectal bleeding and his hemoglobin dropped again to 10.3.  He was finally able to get back on Humira towards end of May 2020.  He is now on maintenance dose of 40 mg every 2 weeks after having received induction dose.  He was also given prescription for dicyclomine to be used on as-needed basis.  He is also not needing loperamide anymore.  He states he is feeling much better.  He is having 2 formed stools daily.  He has not passed blood per rectum and several weeks.  He is now taking 10 mg of prednisone.  He will be dropping dose to 5 mg in 2 days.  He says he is taking dicyclomine only once a day.  He is not having any abdominal pain.  His appetite is much better.  He weighed 241 pounds on 10/12/2020 and 227 pounds on 08/03/2020. He is not having any side effects with Humira/adalimumab. He says he will have his blood work when he leaves office today.   Current Medications: Outpatient Encounter Medications as of 12/16/2020  Medication Sig   Adalimumab (HUMIRA) 40 MG/0.4ML PSKT Inject 40 mg into the skin every 14 (fourteen) days.   amLODipine-valsartan (EXFORGE) 5-160 MG tablet Take 1 tablet by mouth daily.   dicyclomine (BENTYL) 10 MG capsule Take 1 capsule (10 mg total) by mouth 3 (three) times daily as needed for spasms.    hydrochlorothiazide (MICROZIDE) 12.5 MG capsule Take 12.5 mg by mouth daily.   hyoscyamine (ANASPAZ) 0.125 MG TBDP disintergrating tablet DISSOLVE ONE TABLET UNDER THE TONGUE THREE TIMES DAILY AS NEEDED   loperamide (IMODIUM) 2 MG capsule Take 2 capsules (4 mg total) by mouth daily. Patient takes( two) 2 mg  Every morning @@ 4 am. (Patient taking differently: Take 4 mg by mouth daily. Patient takes( two) 2 mg  Every morning @@ 4 am.)   mesalamine (APRISO) 0.375 g 24 hr capsule Take 1 capsule (0.375 g total) by mouth daily.   Pediatric Multivitamins-Iron (FLINTSTONES COMPLETE) 18 MG CHEW Chew 1 tablet by mouth 2 (two) times daily.   predniSONE (DELTASONE) 10 MG tablet Take 5 tablets (50 mg total) by mouth daily with breakfast.   [DISCONTINUED] cephALEXin (KEFLEX) 500 MG capsule Take 500 mg by mouth 4 (four) times daily.   No facility-administered encounter medications on file as of 12/16/2020.     Objective: Blood pressure 138/81, pulse 99, temperature 98 F (36.7 C), temperature source Oral, height _0  (1.93 m), weight 248 lb (112.5 kg). Patient is alert and in no acute distress. Conjunctiva is pink. Sclera is nonicteric Oropharyngeal mucosa is normal. No neck masses or thyromegaly noted. Cardiac exam with regular rhythm normal S1 and S2. No murmur or gallop noted. Lungs are clear to auscultation. Abdomen is full but soft and nontender with organomegaly or masses. No LE edema or clubbing noted.  Labs/studies Results:   CBC Latest Ref Rng & Units 10/18/2020 12/09/2019 12/03/2018  WBC 3.8 - 10.8 Thousand/uL 12.6(H) 6.5 6.3  Hemoglobin 13.2 - 17.1 g/dL 12.3(L) 15.0 13.0(L)  Hematocrit 38.5 - 50.0 % 38.9 46.4 38.6  Platelets 140 - 400 Thousand/uL 424(H) 289 302    CMP Latest Ref Rng & Units 12/09/2019 06/07/2018 10/24/2017  Glucose 65 - 99 mg/dL 88 96 97  BUN 7 - 25 mg/dL _0 Creatinine 0.60 - 1.35 mg/dL 1.13 1.08 0.98  Sodium 135 - 146 mmol/L 141 142 143  Potassium 3.5 - 5.3 mmol/L  4.2 3.9 4.2  Chloride 98 - 110 mmol/L 107 106 108  CO2 20 - 32 mmol/L _1 Calcium 8.6 - 10.3 mg/dL 8.8 9.0 9.1  Total Protein 6.1 - 8.1 g/dL 6.7 6.7 7.0  Total Bilirubin 0.2 - 1.2 mg/dL 0.6 0.5 0.5  Alkaline Phos 38 - 126 U/L - - -  AST 10 - 40 U/L _2 ALT 9 - 46 U/L _3 Hepatic Function Latest Ref Rng & Units 12/09/2019 06/07/2018 10/24/2017  Total Protein 6.1 - 8.1 g/dL 6.7 6.7 7.0  Albumin 3.5 - 5.0 g/dL - - -  AST 10 - 40 U/L _4 ALT 9 - 46 U/L _5 Alk Phosphatase 38 - 126 U/L - - -  Total Bilirubin 0.2 - 1.2 mg/dL 0.6 0.5 0.5    Lab Results  Component Value Date   CRP 4.1 10/18/2020      Assessment:  #1.  Ileocolonic Crohn's disease.  He appears to be back in remission.  He is tolerating prednisone taper without any symptoms suggest relapse.  CRP 2 months ago was normal.  He should be off prednisone in less than 2 weeks. He will continue biologic in order mesalamine for now.  He is only taking 1 Apriso capsule a day.  He needs to be on 4 capsules a day.  If he has sustained remission we will consider dropping oral mesalamine next year.  #2.  History of iron deficiency anemia.  He is due for CBC.   Plan:  Continue prednisone taper as planned. Call if symptoms relapse. Continue Humira/adalimumab and oral mesalamine.  Apriso/oral mesalamine doses 1500 mg daily.  New prescription sent to patient's pharmacy. Fecal calprotectin in 3 months. Office visit in 6 months.

## 2021-01-02 ENCOUNTER — Other Ambulatory Visit (INDEPENDENT_AMBULATORY_CARE_PROVIDER_SITE_OTHER): Payer: Self-pay | Admitting: Internal Medicine

## 2021-01-04 NOTE — Telephone Encounter (Signed)
Last apt 12/16/20 for crohn's and IDA. Next appt is 06/28/21

## 2021-01-22 DIAGNOSIS — L259 Unspecified contact dermatitis, unspecified cause: Secondary | ICD-10-CM | POA: Diagnosis not present

## 2021-01-22 DIAGNOSIS — Z6831 Body mass index (BMI) 31.0-31.9, adult: Secondary | ICD-10-CM | POA: Diagnosis not present

## 2021-01-22 DIAGNOSIS — L249 Irritant contact dermatitis, unspecified cause: Secondary | ICD-10-CM | POA: Diagnosis not present

## 2021-02-23 DIAGNOSIS — Z23 Encounter for immunization: Secondary | ICD-10-CM | POA: Diagnosis not present

## 2021-03-03 ENCOUNTER — Other Ambulatory Visit (INDEPENDENT_AMBULATORY_CARE_PROVIDER_SITE_OTHER): Payer: Self-pay | Admitting: *Deleted

## 2021-03-03 DIAGNOSIS — K5080 Crohn's disease of both small and large intestine without complications: Secondary | ICD-10-CM

## 2021-04-06 ENCOUNTER — Telehealth (INDEPENDENT_AMBULATORY_CARE_PROVIDER_SITE_OTHER): Payer: Self-pay | Admitting: *Deleted

## 2021-04-06 NOTE — Telephone Encounter (Signed)
Discussed with pt. Pt verbalized understanding.

## 2021-04-06 NOTE — Telephone Encounter (Signed)
Pt asking for letter to excuse from jury duty due to having Crohn's. States he is suppose to go next week and court told him he needed a letter stating he has crohn's disease

## 2021-04-06 NOTE — Telephone Encounter (Signed)
Left message to return call 

## 2021-05-10 ENCOUNTER — Telehealth (INDEPENDENT_AMBULATORY_CARE_PROVIDER_SITE_OTHER): Payer: Self-pay | Admitting: *Deleted

## 2021-05-10 ENCOUNTER — Other Ambulatory Visit (INDEPENDENT_AMBULATORY_CARE_PROVIDER_SITE_OTHER): Payer: Self-pay | Admitting: *Deleted

## 2021-05-10 MED ORDER — LOPERAMIDE HCL 2 MG PO CAPS: 2.0000 mg | ORAL_CAPSULE | Freq: Two times a day (BID) | ORAL | 5 refills | Status: DC | PRN

## 2021-05-10 NOTE — Telephone Encounter (Signed)
Ok with 5 refills per dr Laural Golden. Refills sent and pt was notified.

## 2021-05-10 NOTE — Telephone Encounter (Signed)
Request for refill on imodium 63m one bid prn.  Last ov 12/16/20 and has upcoming appt on 2/28

## 2021-05-24 ENCOUNTER — Telehealth (INDEPENDENT_AMBULATORY_CARE_PROVIDER_SITE_OTHER): Payer: Self-pay | Admitting: *Deleted

## 2021-05-24 NOTE — Telephone Encounter (Signed)
Patient dropped off FMLA forms to fill out for Crohn's disease. Forms filled out and pt notified they are ready for pickup and copy made for paper chart.

## 2021-05-26 ENCOUNTER — Telehealth (INDEPENDENT_AMBULATORY_CARE_PROVIDER_SITE_OTHER): Payer: Self-pay | Admitting: *Deleted

## 2021-05-26 NOTE — Telephone Encounter (Signed)
Pt called and states FMLA forms had to have a specific amount of days on form that he could be out of work due to flare ups. Pt states he could go a month without a flare but also might have 2 flare ups a week. Pt states he could miss up to 10 days per month depending on his flare ups. He said HR would fax over new forms to fill out. Await fax.

## 2021-05-27 NOTE — Telephone Encounter (Signed)
Talked with dr Laural Golden and he states he cannot put a number of days on form because it is unpredictable. He did want pt to call in each time he had a flare up. I called and discussed with pt. Pt states he will talk with HR when he gets to work today and have them call me.

## 2021-05-27 NOTE — Telephone Encounter (Signed)
Dwayne Cooper ( HR department) called to ask me if she could email FMLA forms since their fax is not working. I let her know Dr.REhman said he could not put a certain number of days since its unpredictable and she said it needed to say estimate of days and for example he needed 1 day per occurrence.   Lisa's number is [phone] ext [phone].   Await email with forms and will redo.

## 2021-05-31 NOTE — Telephone Encounter (Signed)
Form filled out and I called pt to pick up since his employer is having trouble with the fax machine. Pt states he will pick up.

## 2021-06-06 ENCOUNTER — Other Ambulatory Visit: Payer: Self-pay

## 2021-06-06 ENCOUNTER — Ambulatory Visit (INDEPENDENT_AMBULATORY_CARE_PROVIDER_SITE_OTHER): Payer: BC Managed Care – PPO | Admitting: Gastroenterology

## 2021-06-06 ENCOUNTER — Encounter (INDEPENDENT_AMBULATORY_CARE_PROVIDER_SITE_OTHER): Payer: Self-pay | Admitting: Gastroenterology

## 2021-06-06 VITALS — BP 124/83 | HR 125 | Temp 98.3°F | Ht 76.0 in | Wt 257.4 lb

## 2021-06-06 DIAGNOSIS — H35033 Hypertensive retinopathy, bilateral: Secondary | ICD-10-CM | POA: Diagnosis not present

## 2021-06-06 DIAGNOSIS — R197 Diarrhea, unspecified: Secondary | ICD-10-CM | POA: Diagnosis not present

## 2021-06-06 DIAGNOSIS — K50119 Crohn's disease of large intestine with unspecified complications: Secondary | ICD-10-CM

## 2021-06-06 NOTE — Patient Instructions (Signed)
We will check stool studies as well as inflammatory markers today to rule out infectious cause. If inflammatory markers are elevated, we may need to get you set up for colonoscopy for further evaluation, as last one was 5 years ago. I will be in touch regarding what labs show. Please continue current medication regimen. Stay well hydrated, alternating with water and an electrolyte supplement such as gatorade to help maintain electrolyte balance, given frequent diarrhea.

## 2021-06-06 NOTE — Progress Notes (Signed)
Referring Provider: [facility], [place] Primary Care Physician:  [facility], [place] Primary GI Physician: Laural Golden  Chief Complaint  Patient presents with   Crohn's Disease    Patient states around December he started having up to 20 epidsodes of diarrhea a day. Wakes at night 2 -3 times. Has a spot of blood in stool every now and then. Has hemorrhoids.    HPI:   Dwayne Cooper is a 46 y.o. male with past medical history of Crohn's colitis, HTN.   Patient presenting today for diarrhea and rectal bleeding. Last seen in clinic august 2022.   Diagnosed with ileocolonic Crohn's disease in July 2018, colonoscopy done at [facility], previously maintained on Humira until wife lost her job and he could not afford medication anymore, treated with prednisone and maintained on mesalamine, thereafter. He got back on Humira in May 2020 with maintenance dose of 52m every 2 weeks as well as dicyclomine PRN. Doing much better at last visit with 2 reportedly formed stools daily without recent BRBPR for the past several weeks prior to visit. He was on prednisone 194mdaily at that time, tapering down. Advised to continue on Humira and oral mesalamine at that time. Was supposed to have fecal calprotectin in November, however, this was not completed.   Today, he reports that he is having up to 20 episodes of diarrhea per day, waking 2-3x/night and endorses a spot of blood in his stools about twice per week.  States that symptoms began in early December. He states that prior to this he was having 2 formed BMs per day. Now having watery diarrhea. Denies abdominal pain, fevers, chills, nausea or vomiting. No recent antibiotics. He reports that he has fecal urgency, especially after eating. Having to get up 2-3 times per night to have a BM. Denies any melena or mucus. He is taking imodium twice a day as well as bentyl twice a day without any slowing of frequency of diarrhea.   Last Colonoscopy:11/25/16  [facility] circumfernetial diffuse patch of abnormal mucosa in entire examined colon, mucosa had deep ulcer, scarring, and granulation. 50-75% of surface was affected, bx revealed acute colitis  Last Endoscopy:never  Past Medical History:  Diagnosis Date   Crohn's colitis ([facility]9/08/2016   Essential hypertension, benign 01/03/2017   Rectal bleeding    chronic    History reviewed. No pertinent surgical history.  Current Outpatient Medications  Medication Sig Dispense Refill   Adalimumab (HUMIRA) 40 MG/0.4ML PSKT Inject 40 mg into the skin every 14 (fourteen) days. 6 each 3   amLODipine-valsartan (EXFORGE) 5-160 MG tablet Take 1 tablet by mouth daily.     dicyclomine (BENTYL) 10 MG capsule TAKE 1 CAPSULE BY MOUTH THREE TIMES DAILY AS NEEDED FOR SPASMS 90 capsule 2   hydrochlorothiazide (MICROZIDE) 12.5 MG capsule Take 12.5 mg by mouth daily.     loperamide (IMODIUM) 2 MG capsule Take 1 capsule (2 mg total) by mouth 2 (two) times daily as needed for diarrhea or loose stools. 60 capsule 5   mesalamine (APRISO) 0.375 g 24 hr capsule Take 4 capsules (1.5 g total) by mouth daily. 120 capsule 5   Multiple Vitamin (MULTIVITAMIN) tablet Take 1 tablet by mouth daily.     No current facility-administered medications for this visit.    Allergies as of 06/06/2021 - Review Complete 06/06/2021  Allergen Reaction Noted   Morphine and related  02/10/2020    History reviewed. No pertinent family history.  Social History   Socioeconomic History  Marital status: Married    Spouse name: Not on file   Number of children: Not on file   Years of education: Not on file   Highest education level: Not on file  Occupational History   Not on file  Tobacco Use   Smoking status: Never   Smokeless tobacco: Never  Vaping Use   Vaping Use: Never used  Substance and Sexual Activity   Alcohol use: No   Drug use: No   Sexual activity: Not on file  Other Topics Concern   Not on file  Social History Narrative    Not on file   Social Determinants of Health   Financial Resource Strain: Not on file  Food Insecurity: Not on file  Transportation Needs: Not on file  Physical Activity: Not on file  Stress: Not on file  Social Connections: Not on file   Review of systems General: negative for malaise, night sweats, fever, chills, weight loss Neck: Negative for lumps, goiter, pain and significant neck swelling Resp: Negative for cough, wheezing, dyspnea at rest CV: Negative for chest pain, leg swelling, palpitations, orthopnea GI: denies melena, nausea, vomiting, constipation, dysphagia, odyonophagia, early satiety or unintentional weight loss. +diarrhea +hematochezia MSK: Negative for joint pain or swelling, back pain, and muscle pain. Derm: Negative for itching or rash Psych: Denies depression, anxiety, memory loss, confusion. No homicidal or suicidal ideation.  Heme: Negative for prolonged bleeding, bruising easily, and swollen nodes. Endocrine: Negative for cold or heat intolerance, polyuria, polydipsia and goiter. Neuro: negative for tremor, gait imbalance, syncope and seizures. The remainder of the review of systems is noncontributory.  Physical Exam: BP 124/83 (BP Location: Left Arm, Patient Position: Sitting, Cuff Size: Large)    Pulse (!) 125    Temp 98.3 F (36.8 C) (Oral)    Ht 6' 4"  (1.93 m)    Wt 257 lb 6.4 oz (116.8 kg)    BMI 31.33 kg/m  General:   Alert and oriented. No distress noted. Pleasant and cooperative.  Head:  Normocephalic and atraumatic. Eyes:  Conjuctiva clear without scleral icterus. Mouth:  Oral mucosa pink and moist. Good dentition. No lesions. Heart: Normal rate and rhythm, s1 and s2 heart sounds present.  Lungs: Clear lung sounds in all lobes. Respirations equal and unlabored. Abdomen:  +BS, soft, non-tender and non-distended. No rebound or guarding. No HSM or masses noted. Derm: No palmar erythema or jaundice Msk:  Symmetrical without gross deformities. Normal  posture. Extremities:  Without edema. Neurologic:  Alert and  oriented x4 Psych:  Alert and cooperative. Normal mood and affect.  Invalid input(s): 6 MONTHS   ASSESSMENT: Dwayne Cooper is a 46 y.o. male presenting today with history of crohn's colitis, for frequent episodes of diarrhea that began in December.   States that back in the beginning of December, he began having frequent episodes of watery diarrhea, up to 20x/day. Previously was having 2 formed BMs on Humira and Apriso, with BID bentyl and was doing well. He denies any recent sick contacts, travel or antibiotics. Has occasional blood in stools maybe twice per week but also has hemorrhoids. Denies abdominal pain, nausea, vomiting, fevers, chills. It is possible this is a flare of his Crohn's colitis, however, we will check stool studies to rule out superimposed infection as well as fecal calprotecin, CRP and basic labs, to evaluate for elevated inflammatory markers indicating Crohn's flare. If inflammatory markers are elevated/stool studies negative, it may be a good idea to proceed with colonoscopy, as last one  was in 2018. I discussed this with patient, he is in agreement with current plan. I will be in touch once stool studies and labs have resulted.    PLAN:  Stool studies 2. fecal calprotectin, CRP 3. CBC and CMP 4. Continue Humira and mesalamine 5. Consider colonoscopy if stool studies negative and CRP/Fecal Calprotectin elevated 6. Stay well hydrated, alternate water and low sugar electrolyte drink such as gatorade to maintain electrolyte balance.  Follow Up: Has 6 month follow up with Dr. Laural Golden at the end of Feb 2023  [place] Alver Sorrow, MSN, APRN, AGNP-C Adult-Gerontology Nurse Practitioner [facility]

## 2021-06-07 DIAGNOSIS — Z6831 Body mass index (BMI) 31.0-31.9, adult: Secondary | ICD-10-CM | POA: Diagnosis not present

## 2021-06-07 DIAGNOSIS — Z20828 Contact with and (suspected) exposure to other viral communicable diseases: Secondary | ICD-10-CM | POA: Diagnosis not present

## 2021-06-07 DIAGNOSIS — R0981 Nasal congestion: Secondary | ICD-10-CM | POA: Diagnosis not present

## 2021-06-11 LAB — COMPREHENSIVE METABOLIC PANEL
AG Ratio: 1.5 (calc) (ref 1.0–2.5)
ALT: 16 U/L (ref 9–46)
AST: 17 U/L (ref 10–40)
Albumin: 4 g/dL (ref 3.6–5.1)
Alkaline phosphatase (APISO): 73 U/L (ref 36–130)
BUN: 18 mg/dL (ref 7–25)
CO2: 29 mmol/L (ref 20–32)
Calcium: 9.1 mg/dL (ref 8.6–10.3)
Chloride: 104 mmol/L (ref 98–110)
Creat: 1.08 mg/dL (ref 0.60–1.29)
Globulin: 2.6 g/dL (calc) (ref 1.9–3.7)
Glucose, Bld: 111 mg/dL (ref 65–139)
Potassium: 4.2 mmol/L (ref 3.5–5.3)
Sodium: 141 mmol/L (ref 135–146)
Total Bilirubin: 0.5 mg/dL (ref 0.2–1.2)
Total Protein: 6.6 g/dL (ref 6.1–8.1)

## 2021-06-11 LAB — CBC WITH DIFFERENTIAL/PLATELET
Absolute Monocytes: 714 cells/uL (ref 200–950)
Basophils Absolute: 34 cells/uL (ref 0–200)
Basophils Relative: 0.4 %
Eosinophils Absolute: 172 cells/uL (ref 15–500)
Eosinophils Relative: 2 %
HCT: 42.9 % (ref 38.5–50.0)
Hemoglobin: 13.6 g/dL (ref 13.2–17.1)
Lymphs Abs: 1479 cells/uL (ref 850–3900)
MCH: 24.7 pg — ABNORMAL LOW (ref 27.0–33.0)
MCHC: 31.7 g/dL — ABNORMAL LOW (ref 32.0–36.0)
MCV: 77.9 fL — ABNORMAL LOW (ref 80.0–100.0)
MPV: 10.2 fL (ref 7.5–12.5)
Monocytes Relative: 8.3 %
Neutro Abs: 6201 cells/uL (ref 1500–7800)
Neutrophils Relative %: 72.1 %
Platelets: 363 10*3/uL (ref 140–400)
RBC: 5.51 10*6/uL (ref 4.20–5.80)
RDW: 16.4 % — ABNORMAL HIGH (ref 11.0–15.0)
Total Lymphocyte: 17.2 %
WBC: 8.6 10*3/uL (ref 3.8–10.8)

## 2021-06-11 LAB — C-REACTIVE PROTEIN: CRP: 11.6 mg/L — ABNORMAL HIGH (ref <8.0)

## 2021-06-11 LAB — CALPROTECTIN: Calprotectin: 2490 mcg/g — ABNORMAL HIGH

## 2021-06-15 ENCOUNTER — Telehealth (INDEPENDENT_AMBULATORY_CARE_PROVIDER_SITE_OTHER): Payer: Self-pay

## 2021-06-15 ENCOUNTER — Encounter (INDEPENDENT_AMBULATORY_CARE_PROVIDER_SITE_OTHER): Payer: Self-pay

## 2021-06-15 ENCOUNTER — Other Ambulatory Visit (INDEPENDENT_AMBULATORY_CARE_PROVIDER_SITE_OTHER): Payer: Self-pay

## 2021-06-15 DIAGNOSIS — K625 Hemorrhage of anus and rectum: Secondary | ICD-10-CM

## 2021-06-15 DIAGNOSIS — K50119 Crohn's disease of large intestine with unspecified complications: Secondary | ICD-10-CM

## 2021-06-15 DIAGNOSIS — R197 Diarrhea, unspecified: Secondary | ICD-10-CM

## 2021-06-15 MED ORDER — PEG 3350-KCL-NA BICARB-NACL 420 G PO SOLR: 4000.0000 mL | ORAL | 0 refills | Status: DC

## 2021-06-15 NOTE — Telephone Encounter (Signed)
Jatavius Ellenwood Ann Gavan Nordby, CMA  ?

## 2021-06-21 ENCOUNTER — Telehealth (INDEPENDENT_AMBULATORY_CARE_PROVIDER_SITE_OTHER): Payer: Self-pay | Admitting: *Deleted

## 2021-06-21 NOTE — Telephone Encounter (Signed)
Appt on 2/28 he wants to cancel and reschedule

## 2021-06-21 NOTE — Telephone Encounter (Signed)
Patient wants to reschedule 6 month follow up with dr Laural Golden on 2/28. States his colonoscopy is the next day. Please reschedule pt.   [phone]

## 2021-06-22 NOTE — Telephone Encounter (Signed)
error 

## 2021-06-24 NOTE — Patient Instructions (Signed)
Dwayne Cooper  06/24/2021     @PREFPERIOPPHARMACY @   Your procedure is scheduled on  06/29/2021.   Report to [facility] at  1230  P.M.   Call this number if you have problems the morning of surgery:  [phone]   Remember:  Follow the diet and prep instructions given to you by the office.    Take these medicines the morning of surgery with A SIP OF WATER                                          None     Do not wear jewelry, make-up or nail polish.  Do not wear lotions, powders, or perfumes, or deodorant.  Do not shave 48 hours prior to surgery.  Men may shave face and neck.  Do not bring valuables to the hospital.  [facility] is not responsible for any belongings or valuables.  Contacts, dentures or bridgework may not be worn into surgery.  Leave your suitcase in the car.  After surgery it may be brought to your room.  For patients admitted to the hospital, discharge time will be determined by your treatment team.  Patients discharged the day of surgery will not be allowed to drive home and must have someone with them for 24 hours.    Special instructions:   DO NOT smoke tobacco or vape for 24 hours before your procedure.  Please read over the following fact sheets that you were given. Anesthesia Post-op Instructions and Care and Recovery After Surgery      Colonoscopy, Adult, Care After This sheet gives you information about how to care for yourself after your procedure. Your health care provider may also give you more specific instructions. If you have problems or questions, contact your health care provider. What can I expect after the procedure? After the procedure, it is common to have: A small amount of blood in your stool for 24 hours after the procedure. Some gas. Mild cramping or bloating of your abdomen. Follow these instructions at home: Eating and drinking  Drink enough fluid to keep your urine pale yellow. Follow instructions from your  health care provider about eating or drinking restrictions. Resume your normal diet as instructed by your health care provider. Avoid heavy or fried foods that are hard to digest. Activity Rest as told by your health care provider. Avoid sitting for a long time without moving. Get up to take short walks every 1-2 hours. This is important to improve blood flow and breathing. Ask for help if you feel weak or unsteady. Return to your normal activities as told by your health care provider. Ask your health care provider what activities are safe for you. Managing cramping and bloating  Try walking around when you have cramps or feel bloated. Apply heat to your abdomen as told by your health care provider. Use the heat source that your health care provider recommends, such as a moist heat pack or a heating pad. Place a towel between your skin and the heat source. Leave the heat on for 20-30 minutes. Remove the heat if your skin turns bright red. This is especially important if you are unable to feel pain, heat, or cold. You may have a greater risk of getting burned. General instructions If you were given a sedative during the procedure, it can affect you  for several hours. Do not drive or operate machinery until your health care provider says that it is safe. For the first 24 hours after the procedure: Do not sign important documents. Do not drink alcohol. Do your regular daily activities at a slower pace than normal. Eat soft foods that are easy to digest. Take over-the-counter and prescription medicines only as told by your health care provider. Keep all follow-up visits as told by your health care provider. This is important. Contact a health care provider if: You have blood in your stool 2-3 days after the procedure. Get help right away if you have: More than a small spotting of blood in your stool. Large blood clots in your stool. Swelling of your abdomen. Nausea or vomiting. A  fever. Increasing pain in your abdomen that is not relieved with medicine. Summary After the procedure, it is common to have a small amount of blood in your stool. You may also have mild cramping and bloating of your abdomen. If you were given a sedative during the procedure, it can affect you for several hours. Do not drive or operate machinery until your health care provider says that it is safe. Get help right away if you have a lot of blood in your stool, nausea or vomiting, a fever, or increased pain in your abdomen. This information is not intended to replace advice given to you by your health care provider. Make sure you discuss any questions you have with your health care provider. Document Revised: 02/21/2019 Document Reviewed: 11/11/2018 Elsevier Patient Education  [place] After This sheet gives you information about how to care for yourself after your procedure. Your health care provider may also give you more specific instructions. If you have problems or questions, contact your health care provider. What can I expect after the procedure? After the procedure, it is common to have: Tiredness. Forgetfulness about what happened after the procedure. Impaired judgment for important decisions. Nausea or vomiting. Some difficulty with balance. Follow these instructions at home: For the time period you were told by your health care provider:   Rest as needed. Do not participate in activities where you could fall or become injured. Do not drive or use machinery. Do not drink alcohol. Do not take sleeping pills or medicines that cause drowsiness. Do not make important decisions or sign legal documents. Do not take care of children on your own. Eating and drinking Follow the diet that is recommended by your health care provider. Drink enough fluid to keep your urine pale yellow. If you vomit: Drink water, juice, or soup when you can drink  without vomiting. Make sure you have little or no nausea before eating solid foods. General instructions Have a responsible adult stay with you for the time you are told. It is important to have someone help care for you until you are awake and alert. Take over-the-counter and prescription medicines only as told by your health care provider. If you have sleep apnea, surgery and certain medicines can increase your risk for breathing problems. Follow instructions from your health care provider about wearing your sleep device: Anytime you are sleeping, including during daytime naps. While taking prescription pain medicines, sleeping medicines, or medicines that make you drowsy. Avoid smoking. Keep all follow-up visits as told by your health care provider. This is important. Contact a health care provider if: You keep feeling nauseous or you keep vomiting. You feel light-headed. You are still sleepy or having  trouble with balance after 24 hours. You develop a rash. You have a fever. You have redness or swelling around the IV site. Get help right away if: You have trouble breathing. You have new-onset confusion at home. Summary For several hours after your procedure, you may feel tired. You may also be forgetful and have poor judgment. Have a responsible adult stay with you for the time you are told. It is important to have someone help care for you until you are awake and alert. Rest as told. Do not drive or operate machinery. Do not drink alcohol or take sleeping pills. Get help right away if you have trouble breathing, or if you suddenly become confused. This information is not intended to replace advice given to you by your health care provider. Make sure you discuss any questions you have with your health care provider. Document Revised: 01/01/2020 Document Reviewed: 03/20/2019 Elsevier Patient Education  2022 [employer].

## 2021-06-27 ENCOUNTER — Other Ambulatory Visit: Payer: Self-pay

## 2021-06-27 ENCOUNTER — Encounter (HOSPITAL_COMMUNITY): Payer: Self-pay

## 2021-06-27 ENCOUNTER — Encounter (HOSPITAL_COMMUNITY)
Admission: RE | Admit: 2021-06-27 | Discharge: 2021-06-27 | Disposition: A | Discharge status: Home / Self Care | Payer: BC Managed Care – PPO | Source: Ambulatory Visit | Attending: Internal Medicine | Admitting: Internal Medicine

## 2021-06-28 ENCOUNTER — Ambulatory Visit (INDEPENDENT_AMBULATORY_CARE_PROVIDER_SITE_OTHER): Payer: 59 | Admitting: Internal Medicine

## 2021-06-29 ENCOUNTER — Encounter (HOSPITAL_COMMUNITY)
Admission: RE | Disposition: A | Discharge status: Home / Self Care | Payer: Self-pay | Source: Home / Self Care | Attending: Internal Medicine

## 2021-06-29 ENCOUNTER — Ambulatory Visit (HOSPITAL_COMMUNITY)
Admission: RE | Admit: 2021-06-29 | Discharge: 2021-06-29 | Disposition: A | Discharge status: Home / Self Care | Payer: BC Managed Care – PPO | Attending: Internal Medicine | Admitting: Internal Medicine

## 2021-06-29 ENCOUNTER — Ambulatory Visit (HOSPITAL_COMMUNITY): Payer: BC Managed Care – PPO | Admitting: Anesthesiology

## 2021-06-29 ENCOUNTER — Encounter (HOSPITAL_COMMUNITY): Payer: Self-pay | Admitting: Internal Medicine

## 2021-06-29 ENCOUNTER — Other Ambulatory Visit: Payer: Self-pay

## 2021-06-29 DIAGNOSIS — I10 Essential (primary) hypertension: Secondary | ICD-10-CM | POA: Insufficient documentation

## 2021-06-29 DIAGNOSIS — Z0389 Encounter for observation for other suspected diseases and conditions ruled out: Secondary | ICD-10-CM | POA: Insufficient documentation

## 2021-06-29 DIAGNOSIS — K5010 Crohn's disease of large intestine without complications: Secondary | ICD-10-CM | POA: Diagnosis not present

## 2021-06-29 DIAGNOSIS — Z79899 Other long term (current) drug therapy: Secondary | ICD-10-CM | POA: Insufficient documentation

## 2021-06-29 DIAGNOSIS — R197 Diarrhea, unspecified: Secondary | ICD-10-CM | POA: Diagnosis not present

## 2021-06-29 DIAGNOSIS — K6389 Other specified diseases of intestine: Secondary | ICD-10-CM | POA: Diagnosis not present

## 2021-06-29 DIAGNOSIS — K635 Polyp of colon: Secondary | ICD-10-CM | POA: Diagnosis not present

## 2021-06-29 DIAGNOSIS — K625 Hemorrhage of anus and rectum: Secondary | ICD-10-CM | POA: Diagnosis not present

## 2021-06-29 DIAGNOSIS — R152 Fecal urgency: Secondary | ICD-10-CM | POA: Insufficient documentation

## 2021-06-29 DIAGNOSIS — D649 Anemia, unspecified: Secondary | ICD-10-CM | POA: Diagnosis not present

## 2021-06-29 DIAGNOSIS — K529 Noninfective gastroenteritis and colitis, unspecified: Secondary | ICD-10-CM | POA: Diagnosis not present

## 2021-06-29 DIAGNOSIS — K5140 Inflammatory polyps of colon without complications: Secondary | ICD-10-CM | POA: Diagnosis not present

## 2021-06-29 DIAGNOSIS — K50119 Crohn's disease of large intestine with unspecified complications: Secondary | ICD-10-CM

## 2021-06-29 HISTORY — PX: BIOPSY: SHX5522

## 2021-06-29 HISTORY — PX: COLONOSCOPY WITH PROPOFOL: SHX5780

## 2021-06-29 HISTORY — PX: POLYPECTOMY: SHX149

## 2021-06-29 LAB — C DIFFICILE QUICK SCREEN W PCR REFLEX
C Diff antigen: NEGATIVE
C Diff interpretation: NOT DETECTED
C Diff toxin: NEGATIVE

## 2021-06-29 SURGERY — COLONOSCOPY WITH PROPOFOL
Anesthesia: General

## 2021-06-29 MED ORDER — PROPOFOL 10 MG/ML IV BOLUS
INTRAVENOUS | Status: DC | PRN
  Administered 2021-06-29: 50 mg via INTRAVENOUS
  Administered 2021-06-29: 30 mg via INTRAVENOUS
  Administered 2021-06-29: 150 mg via INTRAVENOUS
  Administered 2021-06-29: 20 mg via INTRAVENOUS
  Administered 2021-06-29: 30 mg via INTRAVENOUS
  Administered 2021-06-29: 20 mg via INTRAVENOUS

## 2021-06-29 MED ORDER — PREDNISONE 10 MG PO TABS: 30.0000 mg | ORAL_TABLET | Freq: Every day | ORAL | 0 refills | Status: DC

## 2021-06-29 MED ORDER — LACTATED RINGERS IV SOLN
INTRAVENOUS | Status: DC
  Administered 2021-06-29: 1000 mL via INTRAVENOUS

## 2021-06-29 NOTE — Anesthesia Preprocedure Evaluation (Addendum)
Anesthesia Evaluation  ?Patient identified by MRN, date of birth, ID band ?Patient awake ? ? ? ?Reviewed: ?Allergy & Precautions, NPO status , Patient's Chart, lab work & pertinent test results ? ?Airway ?Mallampati: II ? ?TM Distance: >3 FB ?Neck ROM: Full ? ? ? Dental ? ?(+) Dental Advisory Given ?  ?Pulmonary ?neg pulmonary ROS,  ?  ?Pulmonary exam normal ?breath sounds clear to auscultation ? ? ? ? ? ? Cardiovascular ?Exercise Tolerance: Good ?hypertension, Pt. on medications ?Normal cardiovascular exam ?Rhythm:Regular Rate:Normal ? ? ?  ?Neuro/Psych ?negative neurological ROS ? negative psych ROS  ? GI/Hepatic ?Neg liver ROS, Bowel prep,Crohn's colitis ?  ?Endo/Other  ?negative endocrine ROS ? Renal/GU ?negative Renal ROS  ?negative genitourinary ?  ?Musculoskeletal ?negative musculoskeletal ROS ?(+)  ? Abdominal ?  ?Peds ?negative pediatric ROS ?(+)  Hematology ? ?(+) Blood dyscrasia, anemia ,   ?Anesthesia Other Findings ? ? Reproductive/Obstetrics ?negative OB ROS ? ?  ? ? ? ? ? ? ? ? ? ? ? ? ? ?  ?  ? ? ? ? ? ? ? ?Anesthesia Physical ?Anesthesia Plan ? ?ASA: 2 ? ?Anesthesia Plan: General  ? ?Post-op Pain Management: Minimal or no pain anticipated  ? ?Induction: Intravenous ? ?PONV Risk Score and Plan: TIVA ? ?Airway Management Planned: Nasal Cannula and Natural Airway ? ?Additional Equipment:  ? ?Intra-op Plan:  ? ?Post-operative Plan:  ? ?Informed Consent: I have reviewed the patients History and Physical, chart, labs and discussed the procedure including the risks, benefits and alternatives for the proposed anesthesia with the patient or authorized representative who has indicated his/her understanding and acceptance.  ? ? ? ?Dental advisory given ? ?Plan Discussed with: CRNA and Surgeon ? ?Anesthesia Plan Comments:   ? ? ? ? ? ? ?Anesthesia Quick Evaluation ? ?

## 2021-06-29 NOTE — H&P (Addendum)
Dwayne Cooper is an 46 y.o. male.   ?Chief Complaint: Patient is here for colonoscopy. ?HPI: Patient is 46 year old Caucasian male with history of Crohn's colitis who is not responding to adalimumab anymore.  He has been having diarrhea with urgency accidents as a result of rectal bleeding.  Stool studies were recommended but not completed.  He is undergoing colonoscopy to assess disease activity and also rule out CMV colitis before changing therapy. ?His appetite is good and his weight has been stable. ? ?Past Medical History:  ?Diagnosis Date  ? Crohn's colitis ([place]) 01/03/2017  ? Essential hypertension, benign 01/03/2017  ? Rectal bleeding   ? chronic  ? ? ?History reviewed. No pertinent surgical history. ? ?History reviewed. No pertinent family history. ?Social History:  reports that he has never smoked. He has never used smokeless tobacco. He reports that he does not drink alcohol and does not use drugs. ? ?Allergies:  ?Allergies  ?Allergen Reactions  ? Morphine And Related   ?  Patient states that he felt like his body was on fire , N&V  ? ? ?Medications Prior to Admission  ?Medication Sig Dispense Refill  ? Adalimumab (HUMIRA) 40 MG/0.4ML PSKT Inject 40 mg into the skin every 14 (fourteen) days. 6 each 3  ? amLODipine-valsartan (EXFORGE) 5-160 MG tablet Take 1 tablet by mouth daily.    ? dicyclomine (BENTYL) 10 MG capsule TAKE 1 CAPSULE BY MOUTH THREE TIMES DAILY AS NEEDED FOR SPASMS (Patient taking differently: Take 10 mg by mouth in the morning and at bedtime.) 90 capsule 2  ? hydrochlorothiazide (MICROZIDE) 12.5 MG capsule Take 12.5 mg by mouth daily.    ? hyoscyamine (LEVSIN) 0.125 MG tablet Take 0.125 mg by mouth 3 (three) times daily as needed for cramping.    ? loperamide (IMODIUM) 2 MG capsule Take 1 capsule (2 mg total) by mouth 2 (two) times daily as needed for diarrhea or loose stools. 60 capsule 5  ? mesalamine (APRISO) 0.375 g 24 hr capsule Take 4 capsules (1.5 g total) by mouth daily. 120 capsule 5   ? Multiple Vitamin (MULTIVITAMIN) tablet Take 1 tablet by mouth daily.    ? polyethylene glycol-electrolytes (TRILYTE) 420 g solution Take 4,000 mLs by mouth as directed. 4000 mL 0  ? ? ?No results found for this or any previous visit (from the past 48 hour(s)). ?No results found. ? ?Review of Systems ? ?Blood pressure (!) 150/84, pulse 89, temperature 98.3 ?F (36.8 ?C), temperature source Oral, resp. rate 13, height 6' 4"  (1.93 m), weight 113.4 kg, SpO2 97 %. ?Physical Exam ?HENT:  ?   Mouth/Throat:  ?   Mouth: Mucous membranes are moist.  ?   Pharynx: Oropharynx is clear.  ?Eyes:  ?   General: No scleral icterus. ?   Conjunctiva/sclera: Conjunctivae normal.  ?Cardiovascular:  ?   Rate and Rhythm: Normal rate and regular rhythm.  ?   Heart sounds: Normal heart sounds. No murmur heard. ?Pulmonary:  ?   Effort: Pulmonary effort is normal.  ?   Breath sounds: Normal breath sounds.  ?Abdominal:  ?   General: There is no distension.  ?   Palpations: Abdomen is soft.  ?   Tenderness: There is no abdominal tenderness.  ?Musculoskeletal:     ?   General: No swelling.  ?   Cervical back: Neck supple.  ?Lymphadenopathy:  ?   Cervical: No cervical adenopathy.  ?Skin: ?   General: Skin is warm and dry.  ?Neurological:  ?  Mental Status: He is alert.  ?  ? ?Assessment/Plan ? ?Crohn's colitis with persistent symptoms. ?Diagnostic colonoscopy to assess disease activity and rule out superadded infection. ? ?Hildred Laser, MD ?06/29/2021, 1:53 PM ? ? ? ?

## 2021-06-29 NOTE — Anesthesia Procedure Notes (Signed)
Date/Time: 06/29/2021 1:51 PM ?Performed by: Vista Deck, CRNA ?Pre-anesthesia Checklist: Patient identified, Emergency Drugs available, Suction available, Timeout performed and Patient being monitored ?Patient Re-evaluated:Patient Re-evaluated prior to induction ?Oxygen Delivery Method: Nasal Cannula ? ? ? ? ?

## 2021-06-29 NOTE — Transfer of Care (Signed)
Immediate Anesthesia Transfer of Care Note ? ?Patient: Dwayne Cooper ? ?Procedure(s) Performed: COLONOSCOPY WITH PROPOFOL ?BIOPSY ?POLYPECTOMY INTESTINAL ? ?Patient Location: Short Stay ? ?Anesthesia Type:General ? ?Level of Consciousness: awake and patient cooperative ? ?Airway & Oxygen Therapy: Patient Spontanous Breathing ? ?Post-op Assessment: Report given to RN and Post -op Vital signs reviewed and stable ? ?Post vital signs: Reviewed and stable ? ?Last Vitals:  ?Vitals Value Taken Time  ?BP 105/72   ?Temp 37 ?C 06/29/21 1428  ?Pulse 90 06/29/21 1428  ?Resp 17 06/29/21 1428  ?SpO2 96 % 06/29/21 1428  ? ? ?Last Pain:  ?Vitals:  ? 06/29/21 1428  ?TempSrc: Oral  ?PainSc: 0-No pain  ?   ? ?Patients Stated Pain Goal: 6 (06/29/21 1258) ? ?Complications: No notable events documented. ?

## 2021-06-29 NOTE — Anesthesia Postprocedure Evaluation (Signed)
Anesthesia Post Note ? ?Patient: Flay Stormer ? ?Procedure(s) Performed: COLONOSCOPY WITH PROPOFOL ?BIOPSY ?POLYPECTOMY INTESTINAL ? ?Patient location during evaluation: Phase II ?Anesthesia Type: General ?Level of consciousness: awake and alert and oriented ?Pain management: pain level controlled ?Vital Signs Assessment: post-procedure vital signs reviewed and stable ?Respiratory status: spontaneous breathing, nonlabored ventilation and respiratory function stable ?Cardiovascular status: blood pressure returned to baseline and stable ?Postop Assessment: no apparent nausea or vomiting ?Anesthetic complications: no ? ? ?No notable events documented. ? ? ?Last Vitals:  ?Vitals:  ? 06/29/21 1428 06/29/21 1430  ?BP:  105/72  ?Pulse: 90   ?Resp: 17   ?Temp: 37 ?C   ?SpO2: 96%   ?  ?Last Pain:  ?Vitals:  ? 06/29/21 1428  ?TempSrc: Oral  ?PainSc: 0-No pain  ? ? ?  ?  ?  ?  ?  ?  ? ?Braylea Brancato C Kayd Launer ? ? ? ? ?

## 2021-06-29 NOTE — Op Note (Signed)
[facility] ?Patient Name: Dwayne Cooper ?Procedure Date: 06/29/2021 1:39 PM ?MRN: [id number] ?Date of Birth: [birth year] ?Attending MD: Hildred Laser , MD ?CSN: [id number] ?Age: 46 ?Admit Type: Outpatient ?Procedure:                Colonoscopy ?Indications:              Crohn's disease of the colon, Exclusion of Crohn's  ?                          disease of the colon, Disease activity assessment  ?                          of Crohn's disease of the colon ?Providers:                Hildred Laser, MD, Leshara Page, Wynonia Musty  ?                          Tech, Technician ?Referring MD:             Glenda Chroman, MD ?Medicines:                Propofol per Anesthesia ?Complications:            No immediate complications. ?Estimated Blood Loss:     Estimated blood loss was minimal. ?Procedure:                Pre-Anesthesia Assessment: ?                          - Prior to the procedure, a History and Physical  ?                          was performed, and patient medications and  ?                          allergies were reviewed. The patient's tolerance of  ?                          previous anesthesia was also reviewed. The risks  ?                          and benefits of the procedure and the sedation  ?                          options and risks were discussed with the patient.  ?                          All questions were answered, and informed consent  ?                          was obtained. Prior Anticoagulants: The patient has  ?                          taken no previous anticoagulant or antiplatelet  ?  agents. ASA Grade Assessment: II - A patient with  ?                          mild systemic disease. After reviewing the risks  ?                          and benefits, the patient was deemed in  ?                          satisfactory condition to undergo the procedure. ?                          After obtaining informed consent, the colonoscope  ?                           was passed under direct vision. Throughout the  ?                          procedure, the patient's blood pressure, pulse, and  ?                          oxygen saturations were monitored continuously. The  ?                          [id number]-HQ190L ([id number]) scope was introduced through  ?                          the anus and advanced to the the terminal ileum,  ?                          with identification of the appendiceal orifice and  ?                          IC valve. The colonoscopy was performed without  ?                          difficulty. The patient tolerated the procedure  ?                          well. The quality of the bowel preparation was  ?                          adequate. The terminal ileum, ileocecal valve,  ?                          appendiceal orifice, and rectum were photographed. ?Scope In: 1:59:10 PM ?Scope Out: 2:23:24 PM ?Scope Withdrawal Time: 0 hours 20 minutes 41 seconds  ?Total Procedure Duration: 0 hours 24 minutes 14 seconds  ?Findings: ?     The perianal and digital rectal examinations were normal. ?     The terminal ileum appeared normal. ?     An area of mildly congested eroded mucosa was found at the appendiceal  ?     orifice. This was biopsied with a cold forceps for histology.  The  ?     pathology specimen was placed into Bottle Number 1. ?     The ascending colon appeared normal. ?     The transverse colon, hepatic flexure, ascending colon and cecum  ?     appeared normal. ?     Inflammation characterized by congestion (edema), erythema, friability,  ?     granularity and shallow ulcerations was found as patches surrounded by  ?     normal mucosa in the rectum, in the sigmoid colon, in the descending  ?     colon and in the splenic flexure. This was severe, and when compared to  ?     previous examinations, the findings are worsened. Biopsies were taken  ?     because of splenic flexure and descending colon. The pathology specimen  ?     was placed into Bottle Number  2. The pathology specimen was placed into  ?     Bottle Number 2. The pathology specimen from sigmoid colon was placed  ?     into Bottle Number 3. ?     A small polyp was found in the splenic flexure. Biopsies were taken with  ?     a cold forceps for histology. The pathology specimen was placed into  ?     Bottle Number 1. ?Impression:               - The examined portion of the ileum was normal. ?                          - Congested eroded mucosa at the appendiceal  ?                          orifice. Biopsied. ?                          - The ascending colon is normal. ?                          - The transverse colon, hepatic flexure, ascending  ?                          colon and cecum are normal. ?                          - Crohn's colitis involving splenic flexure  ?                          proximal descending colon sigmoid colon and rectum.  ?                          This was severe, worsened compared to previous  ?                          examinations. Biopsied. ?                          - One small polyp at the splenic flexure. Biopsied. ?                          -  Stool sample was obtained and sent to the lab for  ?                          C. difficile stool testing and GI pathogen panel. ?Moderate Sedation: ?     Per Anesthesia Care ?Recommendation:           - Patient has a contact number available for  ?                          emergencies. The signs and symptoms of potential  ?                          delayed complications were discussed with the  ?                          patient. Return to normal activities tomorrow.  ?                          Written discharge instructions were provided to the  ?                          patient. ?                          - Low fiber diet today. ?                          - Discontinue Humira/adalimumab and continue other  ?                          medications ?                          - Prednisone 30 mg p.o. every morning. ?                           - Await pathology results and results of stool  ?                          studies. ?                          -Also request testing for CMV infection. ?Procedure Code(s):        --- Professional --- ?                          [phone], Colonoscopy, flexible; with biopsy, single  ?                          or multiple ?Diagnosis Code(s):        --- Professional --- ?                          K63.89, Other specified diseases of intestine ?                          K50.10, Crohn's  disease of large intestine without  ?                          complications ?                          K63.5, Polyp of colon ?CPT copyright 2019 American Medical Association. All rights reserved. ?The codes documented in this report are preliminary and upon coder review may  ?be revised to meet current compliance requirements. ?Hildred Laser, MD ?Hildred Laser, MD ?06/29/2021 4:02:32 PM ?This report has been signed electronically. ?Number of Addenda: 0 ?

## 2021-06-29 NOTE — Discharge Instructions (Signed)
No aspirin or NSAIDs. ?Discontinue Humira but continue other medications ?Prednisone 30 mg by mouth daily with breakfast.  Can take first dose now. ?Low residue diet. ?No driving for 24 hours. ?Physician will call with results of stool test and biopsy.   ?

## 2021-06-30 LAB — GASTROINTESTINAL PANEL BY PCR, STOOL (REPLACES STOOL CULTURE)

## 2021-07-01 ENCOUNTER — Telehealth (INDEPENDENT_AMBULATORY_CARE_PROVIDER_SITE_OTHER): Payer: Self-pay | Admitting: *Deleted

## 2021-07-01 NOTE — Telephone Encounter (Signed)
Spoke with the patient about the results of the biopsy which showed chronic active colitis consistent with his history of Crohn's colitis. ?Dwayne Cooper will require treatment with steroid-sparing medications. Discussion was held regarding benefits and side effects of immunomodulators/biologicals (including infections, malignancies such as lymphoma, skin cancer, tolerance to medication), as well as need to control her disease clinically and endoscopically (ideally microscopically as well) to avoid recurrence of her symptomatology. ? ?Given the fact that he had some improvement with Humira at a dose of every 2 weeks (his insurance did not approve every week dosing even though his levels were never therapeutic, which is not the standard of care), we will try another anti-TNF.  He will need to be started on Remicade induction dose of 5 mg/kg and follow every 8 weeks.  [name], please start the authorization process for this medication.  Please also send an order for QuantiFERON and hepatitis B serologies as these need to be updated yearly. ?

## 2021-07-01 NOTE — Telephone Encounter (Signed)
Dr. Laural Golden asked for me to check on biopsy results for patient for today. Results not available yet. He wanted me to send to you when available which will probably be over weekend. I will check back on Monday.  ?

## 2021-07-01 NOTE — Telephone Encounter (Signed)
error 

## 2021-07-04 ENCOUNTER — Encounter (HOSPITAL_COMMUNITY): Payer: Self-pay | Admitting: Internal Medicine

## 2021-07-04 LAB — SURGICAL PATHOLOGY

## 2021-07-05 ENCOUNTER — Other Ambulatory Visit (INDEPENDENT_AMBULATORY_CARE_PROVIDER_SITE_OTHER): Payer: Self-pay | Admitting: *Deleted

## 2021-07-06 ENCOUNTER — Other Ambulatory Visit (INDEPENDENT_AMBULATORY_CARE_PROVIDER_SITE_OTHER): Payer: Self-pay | Admitting: *Deleted

## 2021-07-06 DIAGNOSIS — Z79899 Other long term (current) drug therapy: Secondary | ICD-10-CM

## 2021-07-06 DIAGNOSIS — Z111 Encounter for screening for respiratory tuberculosis: Secondary | ICD-10-CM

## 2021-07-06 DIAGNOSIS — Z1159 Encounter for screening for other viral diseases: Secondary | ICD-10-CM

## 2021-07-06 DIAGNOSIS — K5080 Crohn's disease of both small and large intestine without complications: Secondary | ICD-10-CM

## 2021-07-06 NOTE — Telephone Encounter (Signed)
Orders for bloodwork was put in yesterday and I called pt yesterday and placed orders at the front for pick up. Forms filled out for [facility] rheumatology for remicade infusion. Await Dr. Edmund Hilda signature.  ?

## 2021-07-07 ENCOUNTER — Other Ambulatory Visit (INDEPENDENT_AMBULATORY_CARE_PROVIDER_SITE_OTHER): Payer: Self-pay | Admitting: Gastroenterology

## 2021-07-07 ENCOUNTER — Other Ambulatory Visit (INDEPENDENT_AMBULATORY_CARE_PROVIDER_SITE_OTHER): Payer: Self-pay

## 2021-07-07 ENCOUNTER — Other Ambulatory Visit (INDEPENDENT_AMBULATORY_CARE_PROVIDER_SITE_OTHER): Payer: Self-pay | Admitting: *Deleted

## 2021-07-07 NOTE — Progress Notes (Signed)
error 

## 2021-07-08 NOTE — Telephone Encounter (Signed)
Dr. Jenetta Downer signed forms. Waiting for results of bloodtest to send with forms ?

## 2021-07-08 NOTE — Telephone Encounter (Signed)
Labs still pending. ?

## 2021-07-12 NOTE — Telephone Encounter (Signed)
Pt has not picked up lab orders. Await results ?

## 2021-07-15 NOTE — Telephone Encounter (Signed)
Called patient and let him know that I was waiting for lab results to send over with over for remicade. He told me he would get this done. Await results.  ?

## 2021-07-18 NOTE — Telephone Encounter (Signed)
No lab results today when I checked.  ?

## 2021-07-20 DIAGNOSIS — K5080 Crohn's disease of both small and large intestine without complications: Secondary | ICD-10-CM | POA: Diagnosis not present

## 2021-07-20 DIAGNOSIS — Z79899 Other long term (current) drug therapy: Secondary | ICD-10-CM | POA: Diagnosis not present

## 2021-07-20 DIAGNOSIS — Z111 Encounter for screening for respiratory tuberculosis: Secondary | ICD-10-CM | POA: Diagnosis not present

## 2021-07-20 DIAGNOSIS — Z1159 Encounter for screening for other viral diseases: Secondary | ICD-10-CM | POA: Diagnosis not present

## 2021-07-20 NOTE — Telephone Encounter (Signed)
Called patient because I still do not have bw resutls to send over with infusion order. Patient states he will try to do this evening or Friday. I advised patient that we needed results before order could be sent. He verbalized understanding.  ?

## 2021-07-20 NOTE — Telephone Encounter (Signed)
Thanks for the update, yes he needs these tests ?

## 2021-07-20 NOTE — Telephone Encounter (Signed)
Patient stopped by office and let me know he did bw this afternoon. Await results.  ?

## 2021-07-22 NOTE — Telephone Encounter (Signed)
One lab test still pending. Will check on Monday to see if results are final so we can send order.  ?

## 2021-07-23 LAB — QUANTIFERON-TB GOLD PLUS
Mitogen-NIL: >10 IU/mL
NIL: 0.05 IU/mL
QuantiFERON-TB Gold Plus: NEGATIVE
TB1-NIL: 0 IU/mL
TB2-NIL: <0 IU/mL

## 2021-07-23 LAB — HEPATITIS B SURFACE ANTIGEN: Hepatitis B Surface Ag: NONREACTIVE

## 2021-07-25 NOTE — Telephone Encounter (Signed)
Order faxed to [facility] rheumatology with notes and labs. Pt was notified I sent order and waiting for response from [facility] rheumatology.  ?

## 2021-07-26 NOTE — Telephone Encounter (Signed)
[facility] rheumatology called today and states order needs to be changed from remicaid to avsola. New orders filled out and await dr Olevia Perches approval and signature.  ?

## 2021-07-27 NOTE — Telephone Encounter (Signed)
New orders faxed. Await response from [facility] rheumatology ?

## 2021-08-01 ENCOUNTER — Other Ambulatory Visit (INDEPENDENT_AMBULATORY_CARE_PROVIDER_SITE_OTHER): Payer: Self-pay | Admitting: Internal Medicine

## 2021-08-01 NOTE — Telephone Encounter (Signed)
Left message to return call 

## 2021-08-01 NOTE — Telephone Encounter (Signed)
Pt is taking 13m 3 daily. States he has enough for 3 days. Also states he is passing quite a bit of blood. [facility] to [facility]  ?

## 2021-08-02 ENCOUNTER — Other Ambulatory Visit (INDEPENDENT_AMBULATORY_CARE_PROVIDER_SITE_OTHER): Payer: Self-pay | Admitting: *Deleted

## 2021-08-02 MED ORDER — PREDNISONE 10 MG PO TABS
ORAL_TABLET | ORAL | 0 refills | Status: DC
Start: 2021-08-02 — End: 2021-08-09

## 2021-08-02 NOTE — Telephone Encounter (Signed)
Called [place] rheumatology and left message with Dr. Melissa Noon nurse Benjamine Mola to give me a call back with update on if infusion for avsola had been approved and if so is it scheduled yet? ?

## 2021-08-02 NOTE — Telephone Encounter (Signed)
Per dr Laural Golden. Pt needs to come off of predisone. Called [facility]ology and spoke to [employer]. Still waiting for insurance approval. Per dr Laural Golden - pt needs to take take 20m for one week, then 229mfor 1 week, then 1519mor 1 week, then 46m58mr 1 week, then 5mg 60m week then stop. I called and spoke with patient and he verbalized understanding.  ?

## 2021-08-02 NOTE — Telephone Encounter (Signed)
Dwayne Cooper from [facility] rheumatology called back and let me know they are still waiting for authorization from insurance.  ?

## 2021-08-03 ENCOUNTER — Other Ambulatory Visit (INDEPENDENT_AMBULATORY_CARE_PROVIDER_SITE_OTHER): Payer: Self-pay | Admitting: Internal Medicine

## 2021-08-04 DIAGNOSIS — I10 Essential (primary) hypertension: Secondary | ICD-10-CM | POA: Diagnosis not present

## 2021-08-04 DIAGNOSIS — Z23 Encounter for immunization: Secondary | ICD-10-CM | POA: Diagnosis not present

## 2021-08-04 DIAGNOSIS — Z0000 Encounter for general adult medical examination without abnormal findings: Secondary | ICD-10-CM | POA: Diagnosis not present

## 2021-08-08 ENCOUNTER — Telehealth (INDEPENDENT_AMBULATORY_CARE_PROVIDER_SITE_OTHER): Payer: Self-pay | Admitting: *Deleted

## 2021-08-08 DIAGNOSIS — K921 Melena: Secondary | ICD-10-CM | POA: Diagnosis not present

## 2021-08-08 DIAGNOSIS — K625 Hemorrhage of anus and rectum: Secondary | ICD-10-CM | POA: Diagnosis not present

## 2021-08-08 DIAGNOSIS — R109 Unspecified abdominal pain: Secondary | ICD-10-CM | POA: Diagnosis not present

## 2021-08-08 DIAGNOSIS — R1030 Lower abdominal pain, unspecified: Secondary | ICD-10-CM | POA: Diagnosis not present

## 2021-08-08 DIAGNOSIS — K5090 Crohn's disease, unspecified, without complications: Secondary | ICD-10-CM | POA: Diagnosis not present

## 2021-08-08 DIAGNOSIS — Z885 Allergy status to narcotic agent status: Secondary | ICD-10-CM | POA: Diagnosis not present

## 2021-08-08 NOTE — Telephone Encounter (Signed)
Called to get update and Cecille Rubin from [place] rheumatology let me know they are still waiting for approval from insurance.  ?

## 2021-08-08 NOTE — Telephone Encounter (Signed)
Pt aware I will discuss with dr Laural Golden tomorrow ?

## 2021-08-08 NOTE — Telephone Encounter (Signed)
May consider increaseing the dose of prednisone back to 30 mg until Remicade is approved, but will defer this to Dr. Laural Golden. ?

## 2021-08-08 NOTE — Telephone Encounter (Signed)
Called [facility] rheumatology to check on status of avsola. I talked with Cecille Rubin and she said she is still waiting for insurance decision. She told me she would call today to check on status since she has not heard back and then let me know. I called patient and he states he is taking prednisone 43m daily. Dr. RLaural Goldendropped him from 350mlast week and he is to drop by 2m59mach week til off of med. He states over weekend diarrhea and bleeding has gotten worse. Has bloody diarrhea 3 -4 times a day. Toliet is filled with bright red blood. Concerned his blood levels will drop again. He states he has 6 hurmira shots at home and wonders if he should start back on those til he gets infusion. Took last one end of February before colonoscopy on 06/29/21. States dr rehLaural Goldenld him to stop since they were not helping.  ?

## 2021-08-09 ENCOUNTER — Other Ambulatory Visit (INDEPENDENT_AMBULATORY_CARE_PROVIDER_SITE_OTHER): Payer: Self-pay | Admitting: *Deleted

## 2021-08-09 MED ORDER — PREDNISONE 10 MG PO TABS: ORAL_TABLET | ORAL | 0 refills | Status: DC

## 2021-08-09 NOTE — Telephone Encounter (Signed)
Per Dr. Laural Golden cancel medrol 122m dose pack from ER and pt to take prednisone 4622mdaily until day of infusion on the 19th then drop down by 22m69mvery 7 days til he gets to 22mg2men stop. I called and discussed with patient and he verbalized understanding. Med sent to [facility] and I canceled script for medrol 4mg 13me pack from ER.   ?  ?  ?Note   ? ?

## 2021-08-09 NOTE — Telephone Encounter (Signed)
I spoke with the patient he states he went to [facility] last night 08/08/2021 due to the bleeding getting worse. He reports his Hgb has now dropped to 11.5 and they sent in two prescriptions to [facility]. He is aware he is to start [place] [facility] at [place] on 08/17/2021,08/29/2021,09/28/2021. ?  ?

## 2021-08-09 NOTE — Telephone Encounter (Signed)
I spoke with the patient he states he went to [facility] last night 08/08/2021 due to the bleeding getting worse. He reports his Hgb has now dropped to 11.5 and they sent in two prescriptions to [facility]. He is aware he is to start [facility] [place] on 08/17/2021,08/29/2021,09/28/2021. ?  ?

## 2021-08-09 NOTE — Telephone Encounter (Signed)
Per Dr. Laural Golden cancel medrol 72m dose pack from ER and pt to take prednisone 458mdaily until day of infusion on the 19th then drop down by 73m873mvery 7 days til he gets to 73mg77men stop. I called and discussed with patient and he verbalized understanding. Med sent to [facility] and I canceled script for medrol 4mg 36me pack from ER.  ?

## 2021-08-09 NOTE — Telephone Encounter (Signed)
I spoke with Juliann Pulse at [place] the medications that were sent into [facility] for the patient were Zofran 4 mg Q 8 hours prn, also Medrol 4 mg dose pack. Should the patient start? Or Does Dr.Rehman want him on something else? ?

## 2021-08-09 NOTE — Telephone Encounter (Signed)
I spoke with Juliann Pulse at [place] the medications that were sent into [facility] drug for the patient were Zofran 4 mg Q 8 hours prn, also Medrol 4 mg dose pack. Should the patient start? Or Does Dr.Rehman want him on something else? ?

## 2021-08-17 DIAGNOSIS — K5010 Crohn's disease of large intestine without complications: Secondary | ICD-10-CM | POA: Diagnosis not present

## 2021-08-31 DIAGNOSIS — K5010 Crohn's disease of large intestine without complications: Secondary | ICD-10-CM | POA: Diagnosis not present

## 2021-09-20 ENCOUNTER — Encounter (INDEPENDENT_AMBULATORY_CARE_PROVIDER_SITE_OTHER): Payer: Self-pay | Admitting: Gastroenterology

## 2021-09-20 ENCOUNTER — Ambulatory Visit (INDEPENDENT_AMBULATORY_CARE_PROVIDER_SITE_OTHER): Payer: BC Managed Care – PPO | Admitting: Gastroenterology

## 2021-09-20 VITALS — BP 136/81 | HR 94 | Temp 98.1°F | Ht 76.0 in | Wt 259.3 lb

## 2021-09-20 DIAGNOSIS — K50119 Crohn's disease of large intestine with unspecified complications: Secondary | ICD-10-CM | POA: Diagnosis not present

## 2021-09-20 DIAGNOSIS — Z79620 Long term (current) use of immunosuppressive biologic: Secondary | ICD-10-CM

## 2021-09-20 MED ORDER — LOPERAMIDE HCL 2 MG PO CAPS: 2.0000 mg | ORAL_CAPSULE | Freq: Two times a day (BID) | ORAL | 5 refills | Status: AC | PRN

## 2021-09-20 NOTE — Progress Notes (Signed)
Referring Provider: Health, Andre Lefort* Primary Care Physician:  [facility] Primary GI Physician: castaneda  Chief Complaint  Patient presents with   Follow-up    Patient here today for a follow up visit. He states he is taking Avsola and Crohn's is doing well.   HPI:   Dwayne Cooper is a 46 y.o. male with past medical history of Crohn's Colitis, HTN.  Patient presenting today for follow up of Crohn's disease.  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 He got back on Humira in May 2020 with maintenance dose of 33m every 2 weeks as well as dicyclomine PRN.   Patient last seen 06/06/21, at that time, he was having up to 20 episodes of watery diarrhea per day, waking 2-3x/night w/spot of blood in his stools about twice per week.  these symptoms began in early December. Having 2 formed BMs per day prior to this.  taking imodium twice a day as well as bentyl twice a day without any slowing of frequency of diarrhea. stool studies, fecal calprotectin, CRP, CBC, CMP ordered, advised to continue mesalamine and Humira, considerations for colonoscopy if CRP/fecal calprotectin elevated.   CRP 11.6 cal protectin 2490 06/06/21 C diff and Gi path panel completed 3/1 were negative Patient underwent colonoscopy on 06/29/21, findings as outlined below, patient discontinued off Humira at that time, Given the fact that he had some improvement with Humira at a dose of every 2 weeks (his insurance did not approve every week dosing even though his levels were never therapeutic, patient started on Remicade induction dose of 5 mg/kg and follow every 8 weeks.  Hep B and TB Quant negative on 3/22  Patient required another medrol dose pack in mid April due to continued rectal bleeding  and diarrhea, Avsola was started at dose of 556mkg on 4/19 with next dose 5/3 and upcoming dose on 5/31.  Present: Reports he has had two dose of Avsola, is still taking 2 imodium twice per day. Having 2-3 BMs per day. Starts with looser stool in the morning, usually more formed if he has another BM later in the day. Noticing an occasional spot of blood maybe once per day, just with wiping. Has occasional burning  sensation in mid to lower abdomen, this improves after a BM. Appetite is good, he denies any weight loss. Denies mucus in stools. Denies fevers, chills, cough, sore throat. Reports that he recently got a new job and has much less stress. Denies joint pain, rashes or skin lesions.    Is up to date on flu vaccine, has had 2 covid vaccines, previously.   Last Colonoscopy:-  06/29/21 The examined portion of the ileum was normal. - Congested eroded mucosa at the appendiceal orifice. Active colitis - The ascending colon is normal. - The transverse colon, hepatic flexure, ascending colon and cecum are normal. - Crohn's colitis involving splenic flexure proximal descending colon sigmoid colon and rectum. This was severe, worsened compared to previous examinations.moderately active chronic colitis - One small polyp at the splenic flexure. Inflammatory polyp Last Endoscopy:never  Recommendations:   Past Medical History:  Diagnosis Date   Crohn's colitis ([facility]9/08/2016   Essential hypertension, benign 01/03/2017   Rectal bleeding    chronic    Past Surgical History:  Procedure Laterality Date   BIOPSY  06/29/2021  Procedure: BIOPSY;  Surgeon: Rogene Houston, MD;  [facility];  Service: Endoscopy;;   COLONOSCOPY WITH PROPOFOL N/A 06/29/2021   Procedure: COLONOSCOPY WITH PROPOFOL;  Surgeon: Rogene Houston, MD;  [facility];  Service: Endoscopy;  Laterality: N/A;  205 ASA 1   POLYPECTOMY  06/29/2021   Procedure: POLYPECTOMY INTESTINAL;  Surgeon: Rogene Houston, MD;   [facility]: [facility];  Service: Endoscopy;;    Current Outpatient Medications  Medication Sig Dispense Refill   amLODipine-valsartan (EXFORGE) 5-160 MG tablet Take 1 tablet by mouth daily.     dicyclomine (BENTYL) 10 MG capsule TAKE 1 CAPSULE BY MOUTH THREE TIMES DAILY AS NEEDED FOR SPASMS 90 capsule 2   hydrochlorothiazide (MICROZIDE) 12.5 MG capsule Take 12.5 mg by mouth daily.     hyoscyamine (LEVSIN) 0.125 MG tablet Take 0.125 mg by mouth daily.     loperamide (IMODIUM) 2 MG capsule Take 1 capsule (2 mg total) by mouth 2 (two) times daily as needed for diarrhea or loose stools. 60 capsule 5   predniSONE (DELTASONE) 10 MG tablet Take 4 tablets every day and taper down by 18m every 7 days as directed by Dr. RLaural Golden 127 tablet 0   Probiotic Product (PROBIOTIC ADVANCED PO) Take by mouth daily at 6 (six) AM.     No current facility-administered medications for this visit.    Allergies as of 09/20/2021 - Review Complete 09/20/2021  Allergen Reaction Noted   Morphine and related  02/10/2020    History reviewed. No pertinent family history.  Social History   Socioeconomic History   Marital status: Married    Spouse name: Not on file   Number of children: Not on file   Years of education: Not on file   Highest education level: Not on file  Occupational History   Not on file  Tobacco Use   Smoking status: Never   Smokeless tobacco: Never  Vaping Use   Vaping Use: Never used  Substance and Sexual Activity   Alcohol use: No   Drug use: No   Sexual activity: Yes  Other Topics Concern   Not on file  Social History Narrative   Not on file   Social Determinants of Health   Financial Resource Strain: Not on file  Food Insecurity: Not on file  Transportation Needs: Not on file  Physical Activity: Not on file  Stress: Not on file  Social Connections: Not on file   Review of systems General: negative for malaise, night sweats, fever, chills, weight loss Neck: Negative for  lumps, goiter, pain and significant neck swelling Resp: Negative for cough, wheezing, dyspnea at rest CV: Negative for chest pain, leg swelling, palpitations, orthopnea GI: denies melena, hematochezia, nausea, vomiting, constipation, dysphagia, odyonophagia, early satiety or unintentional weight loss. +looser to formed stools +toilet tissue hematochezia MSK: Negative for joint pain or swelling, back pain, and muscle pain. Derm: Negative for itching or rash Psych: Denies depression, anxiety, memory loss, confusion. No homicidal or suicidal ideation.  Heme: Negative for prolonged bleeding, bruising easily, and swollen nodes. Endocrine: Negative for cold or heat intolerance, polyuria, polydipsia and goiter. Neuro: negative for tremor, gait imbalance, syncope and seizures. The remainder of the review of systems is noncontributory.  Physical Exam: BP 136/81 (BP Location: Left Arm, Patient Position: Sitting, Cuff Size: Large)   Pulse 94   Temp 98.1 F (36.7 C) (Oral)   Ht 6' 4"  (1.93 m)   Wt 259 lb 4.8 oz (117.6 kg)  BMI 31.56 kg/m  General:   Alert and oriented. No distress noted. Pleasant and cooperative.  Head:  Normocephalic and atraumatic. Eyes:  Conjuctiva clear without scleral icterus. Mouth:  Oral mucosa pink and moist. Good dentition. No lesions. Heart: Normal rate and rhythm, s1 and s2 heart sounds present.  Lungs: Clear lung sounds in all lobes. Respirations equal and unlabored. Abdomen:  +BS, soft, non-tender and non-distended. No rebound or guarding. No HSM or masses noted. Derm: No palmar erythema or jaundice Msk:  Symmetrical without gross deformities. Normal posture. Extremities:  Without edema. Neurologic:  Alert and  oriented x4 Psych:  Alert and cooperative. Normal mood and affect.  Invalid input(s): 6 MONTHS   ASSESSMENT: Nicholad Kautzman is a 46 y.o. male presenting today for follow up of Crohn's Colitis since start of Avsola induction on 4/19.  Patient has  completed two infusions of Avsola thus far, next infusion is next week. He is feeling pretty well overall with 2-3 BMs per day, still taking 14m imodium BID. Has initial looser BM in the morning upon waking and subsequent 1-2 BMs are more formed. Denies melena, nausea, vomiting, fevers, chills, rashes, joint pain or any new skin lesions. Has small spot of BRB on toilet paper after wiping, maybe once per day, some abdominal burning that improves with a BM. Appetite is good and he reports he has actually gained some weight. Last induction dose of Avsola is next week, will go to every 8 week dosing, thereafter.   PLAN:  Can continue imodium PRN BID,refill sent  2. Proceed with next Avsola infusion next week, will go to Q8 week dosing thereafter at 570mkg 3. will update CBC, CMP, Vit D, as well as CRP and Fecal Calprotectin at next visit 4. Plan for repeat colonoscopy around September 2023 to evaluate endoscopic remission 6 months after start of Avsola  All questions were answered, patient verbalized understanding and is in agreement with plan as outlined above.   Follow Up: 6-8 weeks  Leevi Cullars L. CaAlver SorrowMSN, APRN, AGNP-C Adult-Gerontology Nurse Practitioner [facility]

## 2021-09-20 NOTE — Patient Instructions (Addendum)
Please proceed with next dose of Avsola on 5/31 as scheduled, will go to every 8 week dosing after that.  We will see you again in about 6 weeks to see how your symptoms are and potentially recheck labs at that time.

## 2021-09-28 DIAGNOSIS — K5010 Crohn's disease of large intestine without complications: Secondary | ICD-10-CM | POA: Diagnosis not present

## 2021-10-31 ENCOUNTER — Encounter (INDEPENDENT_AMBULATORY_CARE_PROVIDER_SITE_OTHER): Payer: Self-pay | Admitting: Gastroenterology

## 2021-10-31 ENCOUNTER — Ambulatory Visit (INDEPENDENT_AMBULATORY_CARE_PROVIDER_SITE_OTHER): Payer: BC Managed Care – PPO | Admitting: Gastroenterology

## 2021-10-31 VITALS — BP 124/84 | HR 85 | Temp 97.9°F | Ht 76.0 in | Wt 247.1 lb

## 2021-10-31 DIAGNOSIS — Z1321 Encounter for screening for nutritional disorder: Secondary | ICD-10-CM | POA: Diagnosis not present

## 2021-10-31 DIAGNOSIS — Z79620 Long term (current) use of immunosuppressive biologic: Secondary | ICD-10-CM

## 2021-10-31 DIAGNOSIS — K5080 Crohn's disease of both small and large intestine without complications: Secondary | ICD-10-CM

## 2021-10-31 DIAGNOSIS — E559 Vitamin D deficiency, unspecified: Secondary | ICD-10-CM | POA: Diagnosis not present

## 2021-10-31 NOTE — Patient Instructions (Signed)
We will check basic labs and inflammatory markers today Will plan to repeat colonoscopy in mid October Continue avsola every 8 weeks at current dosage Please let me know if you have any new GI concerns in the mean time  Follow up 6 months

## 2021-10-31 NOTE — Progress Notes (Signed)
Referring Provider: [facility] Primary Care Physician:  [facility] Primary GI Physician: Jenetta Downer   Chief Complaint  Patient presents with   Crohn's Disease    Follow up on Crohn's. Taking avsola infusions at [facility] rheumatology. States he is doing well and no concerns today.    HPI:   Dwayne Cooper is a 46 y.o. male with past medical history of Crohn's Colitis, HTN  Patient presenting today for follow up of Crohn's disease since starting Avsola.   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 He got back on Humira in May 2020 with maintenance dose of 31m every 2 weeks as well as dicyclomine PRN.  seen 06/06/21, at that time, having up to 20 episodes of watery diarrhea per day, waking 2-3x/night w/spot of blood in his stools about twice per week.  these symptoms began in early December. Having 2 formed BMs per day prior to this.  taking imodium twice a day as well as bentyl twice a day without any slowing of frequency of diarrhea. stool studies, fecal calprotectin, CRP, CBC, CMP ordered, advised to continue mesalamine and Humira, considerations for colonoscopy if CRP/fecal calprotectin elevated.    CRP 11.6 cal protectin 2490 06/06/21 C diff and Gi path panel completed 3/1 were negative Patient underwent colonoscopy on 06/29/21, findings as outlined below, patient discontinued off Humira at that time, Given the fact that he had some improvement with Humira at a dose of every 2 weeks (his insurance did not approve every week dosing even though his levels were never therapeutic, patient started on Remicade induction dose of 5 mg/kg and follow every 8 weeks.   Hep B and TB Quant negative on 3/22   Patient required another medrol dose pack in mid April  due to continued rectal bleeding and diarrhea, Avsola was started at dose of 531mkg on 4/19 with next dose 5/3 and upcoming dose on 5/31.  Last seen 09/20/21, at that time he had had 2 doses of Avsola, still taking imodium 2 BID, having 2-3 BMs per day, starting with looser stools in the morning and more formed later in the day. Noticing an occasional spot of blood when wiping, maybe once per day. Some occasional burning in mid to lower abdomen, improving after BM. Denied mucus in stools . No rashes, joint pain, or skin lesions. UTD on flu vaccine and 2 covid vaccines previously.   Advised would switch to Q8W  dosing at 39m62mg after 3rd infusion, update CBC, CMP, Vit D, CRP and fecal cal at next visit and plan for repeat Colonoscopy 6 months after initiation of Avsola to evaluate for endoscopic remission.  Present: States he is doing well, having 2 BMs per day, still taking 2 imodium in the morning. Stools range from looser to more formed. Denies blood in stools, having a spot of blood on the toilet tissue only on occasion now, no blood in stools/toilet or melena. No nausea, vomiting, no abdominal pain. Appetite is good and weight is stable. He does report that he got sick after last infusion but had not eaten prior to infusion, next infusion is on 7/26. Overall he is doing well and feels that medication is working well for him. Denies any fevers, chills, respiratory symptoms, joint pain, skin lesions.   Last Colonoscopy:-  06/29/21 The examined portion of  the ileum was normal. - Congested eroded mucosa at the appendiceal orifice. Active colitis - The ascending colon is normal. - The transverse colon, hepatic flexure, ascending colon and cecum are normal. - Crohn's colitis involving splenic flexure proximal descending colon sigmoid colon and rectum. This was severe, worsened compared to previous examinations.moderately active chronic colitis - One small polyp at the splenic flexure. Inflammatory  polyp Last Endoscopy:never  Past Medical History:  Diagnosis Date   Crohn's colitis ([place]) 01/03/2017   Essential hypertension, benign 01/03/2017   Rectal bleeding    chronic    Past Surgical History:  Procedure Laterality Date   BIOPSY  06/29/2021   Procedure: BIOPSY;  Surgeon: Rogene Houston, MD;  [facility];  Service: Endoscopy;;   COLONOSCOPY WITH PROPOFOL N/A 06/29/2021   Procedure: COLONOSCOPY WITH PROPOFOL;  Surgeon: Rogene Houston, MD;  [facility];  Service: Endoscopy;  Laterality: N/A;  205 ASA 1   POLYPECTOMY  06/29/2021   Procedure: POLYPECTOMY INTESTINAL;  Surgeon: Rogene Houston, MD;  [facility];  Service: Endoscopy;;    Current Outpatient Medications  Medication Sig Dispense Refill   amLODipine-valsartan (EXFORGE) 5-160 MG tablet Take 1 tablet by mouth daily.     loperamide (IMODIUM) 2 MG capsule Take 1 capsule (2 mg total) by mouth 2 (two) times daily as needed for diarrhea or loose stools. 60 capsule 5   PRESCRIPTION MEDICATION Avsola infusion at [facility] rheumatology     Probiotic Product (PROBIOTIC ADVANCED PO) Take by mouth daily at 6 (six) AM.     dicyclomine (BENTYL) 10 MG capsule TAKE 1 CAPSULE BY MOUTH THREE TIMES DAILY AS NEEDED FOR SPASMS (Patient not taking: Reported on 10/31/2021) 90 capsule 2   hydrochlorothiazide (MICROZIDE) 12.5 MG capsule Take 12.5 mg by mouth daily. (Patient not taking: Reported on 10/31/2021)     hyoscyamine (LEVSIN) 0.125 MG tablet Take 0.125 mg by mouth daily. (Patient not taking: Reported on 10/31/2021)     No current facility-administered medications for this visit.    Allergies as of 10/31/2021 - Review Complete 10/31/2021  Allergen Reaction Noted   Morphine and related  02/10/2020    No family history on file.  Social History   Socioeconomic History   Marital status: Married    Spouse name: Not on file   Number of children: Not on file   Years of education: Not on file   Highest  education level: Not on file  Occupational History   Not on file  Tobacco Use   Smoking status: Never    Passive exposure: Never   Smokeless tobacco: Never  Vaping Use   Vaping Use: Never used  Substance and Sexual Activity   Alcohol use: No   Drug use: No   Sexual activity: Yes  Other Topics Concern   Not on file  Social History Narrative   Not on file   Social Determinants of Health   Financial Resource Strain: Not on file  Food Insecurity: Not on file  Transportation Needs: Not on file  Physical Activity: Not on file  Stress: Not on file  Social Connections: Not on file   Review of systems General: negative for malaise, night sweats, fever, chills, weight loss Neck: Negative for lumps, goiter, pain and significant neck swelling Resp: Negative for cough, wheezing, dyspnea at rest CV: Negative for chest pain, leg swelling, palpitations, orthopnea GI: denies melena, hematochezia, nausea, vomiting, constipation, dysphagia, odyonophagia, early satiety or unintentional weight loss. +loose stools  MSK: Negative for joint pain or swelling, back pain, and muscle pain. Derm: Negative for itching or rash Psych: Denies depression, anxiety, memory loss, confusion. No homicidal or suicidal ideation.  Heme: Negative for prolonged bleeding, bruising easily, and swollen nodes. Endocrine: Negative for cold or heat intolerance, polyuria, polydipsia and goiter. Neuro: negative for tremor, gait imbalance, syncope and seizures. The remainder of the review of systems is noncontributory.  Physical Exam: BP 124/84 (BP Location: Left Arm, Patient Position: Sitting, Cuff Size: Large)   Pulse 85   Temp 97.9 F (36.6 C) (Oral)   Ht 6' 4"  (1.93 m)   Wt 247 lb 1.6 oz (112.1 kg)   BMI 30.08 kg/m  General:   Alert and oriented. No distress noted. Pleasant and cooperative.  Head:  Normocephalic and atraumatic. Eyes:  Conjuctiva clear without scleral icterus. Mouth:  Oral mucosa pink and moist.  Good dentition. No lesions. Heart: Normal rate and rhythm, s1 and s2 heart sounds present.  Lungs: Clear lung sounds in all lobes. Respirations equal and unlabored. Abdomen:  +BS, soft, non-tender and non-distended. No rebound or guarding. No HSM or masses noted. Derm: No palmar erythema or jaundice Msk:  Symmetrical without gross deformities. Normal posture. Extremities:  Without edema. Neurologic:  Alert and  oriented x4 Psych:  Alert and cooperative. Normal mood and affect.  Invalid input(s): "6 MONTHS"   ASSESSMENT: Dwayne Cooper is a 46 y.o. male presenting today for follow up on Crohn's disease, currently on Avsola 73m/kg Q8W.   Patient doing very well with 2 BMs per day ranging from loose to formed. Denies rectal bleeding, melena, mucus in stools abdominal pain, nausea or vomiting. Has occasional toilet tissue hematochezia but no blood in stools. Appetite is good.  Denies joint pain, rashes, fevers, chills, respiratory symptoms or skin lesion. Overall feels he is doing very well on Avsola. Will continue medication at current dosage and frequency. He is due for colonoscopy in mid October to evaluate for endoscopic remission s/p 6 months of treatment induction. Will get him scheduled for this. Will update labs today. Indications, risks and benefits of procedure discussed in detail with patient. Patient verbalized understanding and is in agreement to proceed with Colonoscopy at this time.    PLAN:  CBC, CMP, VIt D, Fecal Cal and CRP 2. Continue Avsola 583mkg Q8W 3. Colonoscopy in mid October 2023  All questions were answered, patient verbalized understanding and is in agreement with plan as outlined above.    Follow Up: 6 months  Kaedynce Tapp L. CaAlver SorrowMSN, APRN, AGNP-C Adult-Gerontology Nurse Practitioner [facility]

## 2021-11-01 LAB — CBC WITH DIFFERENTIAL/PLATELET
Absolute Monocytes: 437 cells/uL (ref 200–950)
Basophils Absolute: 32 cells/uL (ref 0–200)
Basophils Relative: 0.6 %
Eosinophils Absolute: 346 cells/uL (ref 15–500)
Eosinophils Relative: 6.4 %
HCT: 37.4 % — ABNORMAL LOW (ref 38.5–50.0)
Hemoglobin: 12.2 g/dL — ABNORMAL LOW (ref 13.2–17.1)
Lymphs Abs: 1550 cells/uL (ref 850–3900)
MCH: 25.2 pg — ABNORMAL LOW (ref 27.0–33.0)
MCHC: 32.6 g/dL (ref 32.0–36.0)
MCV: 77.1 fL — ABNORMAL LOW (ref 80.0–100.0)
MPV: 10.2 fL (ref 7.5–12.5)
Monocytes Relative: 8.1 %
Neutro Abs: 3035 cells/uL (ref 1500–7800)
Neutrophils Relative %: 56.2 %
Platelets: 324 10*3/uL (ref 140–400)
RBC: 4.85 10*6/uL (ref 4.20–5.80)
RDW: 14.7 % (ref 11.0–15.0)
Total Lymphocyte: 28.7 %
WBC: 5.4 10*3/uL (ref 3.8–10.8)

## 2021-11-01 LAB — COMPREHENSIVE METABOLIC PANEL
AG Ratio: 1.6 (calc) (ref 1.0–2.5)
ALT: 17 U/L (ref 9–46)
AST: 19 U/L (ref 10–40)
Albumin: 3.9 g/dL (ref 3.6–5.1)
Alkaline phosphatase (APISO): 61 U/L (ref 36–130)
BUN: 24 mg/dL (ref 7–25)
CO2: 22 mmol/L (ref 20–32)
Calcium: 8.9 mg/dL (ref 8.6–10.3)
Chloride: 110 mmol/L (ref 98–110)
Creat: 1.1 mg/dL (ref 0.60–1.29)
Globulin: 2.5 g/dL (calc) (ref 1.9–3.7)
Glucose, Bld: 88 mg/dL (ref 65–99)
Potassium: 4 mmol/L (ref 3.5–5.3)
Sodium: 142 mmol/L (ref 135–146)
Total Bilirubin: 0.8 mg/dL (ref 0.2–1.2)
Total Protein: 6.4 g/dL (ref 6.1–8.1)

## 2021-11-01 LAB — C-REACTIVE PROTEIN: CRP: 9.6 mg/L — ABNORMAL HIGH (ref <8.0)

## 2021-11-01 LAB — VITAMIN D 25 HYDROXY (VIT D DEFICIENCY, FRACTURES): Vit D, 25-Hydroxy: 25 ng/mL — ABNORMAL LOW (ref 30–100)

## 2021-11-07 ENCOUNTER — Other Ambulatory Visit (INDEPENDENT_AMBULATORY_CARE_PROVIDER_SITE_OTHER): Payer: Self-pay | Admitting: Gastroenterology

## 2021-11-07 MED ORDER — VITAMIN D-3 125 MCG (5000 UT) PO TABS: 5000.0000 [IU] | ORAL_TABLET | Freq: Every day | ORAL | 2 refills | Status: DC

## 2021-11-23 DIAGNOSIS — K5010 Crohn's disease of large intestine without complications: Secondary | ICD-10-CM | POA: Diagnosis not present

## 2021-12-23 ENCOUNTER — Encounter (INDEPENDENT_AMBULATORY_CARE_PROVIDER_SITE_OTHER): Payer: Self-pay | Admitting: *Deleted

## 2022-01-18 DIAGNOSIS — K5010 Crohn's disease of large intestine without complications: Secondary | ICD-10-CM | POA: Diagnosis not present

## 2022-03-27 DIAGNOSIS — K5010 Crohn's disease of large intestine without complications: Secondary | ICD-10-CM | POA: Diagnosis not present

## 2022-03-30 DIAGNOSIS — K5010 Crohn's disease of large intestine without complications: Secondary | ICD-10-CM | POA: Diagnosis not present

## 2022-05-15 ENCOUNTER — Ambulatory Visit (INDEPENDENT_AMBULATORY_CARE_PROVIDER_SITE_OTHER): Payer: No Typology Code available for payment source | Admitting: Gastroenterology

## 2022-05-15 ENCOUNTER — Encounter (INDEPENDENT_AMBULATORY_CARE_PROVIDER_SITE_OTHER): Payer: Self-pay | Admitting: Gastroenterology

## 2022-05-15 VITALS — BP 99/67 | HR 104 | Temp 97.9°F | Ht 76.0 in | Wt 270.3 lb

## 2022-05-15 DIAGNOSIS — K5080 Crohn's disease of both small and large intestine without complications: Secondary | ICD-10-CM

## 2022-05-15 DIAGNOSIS — Z1321 Encounter for screening for nutritional disorder: Secondary | ICD-10-CM

## 2022-05-15 NOTE — Progress Notes (Signed)
Maylon Peppers, M.D. Gastroenterology & Hepatology [facility] [address] [place], [place] [address]  Primary Care Physician: Practice, [facility] [place] [address]  I will communicate my assessment and recommendations to the referring MD via EMR.  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 male with past history of ileocolonic Crohn's disease and hypertension, who presents for follow up of Crohn's disease.  The patient was last seen on 10/31/2021. At that time, the patient was continued on Avsola every 8 weeks.  Labs showed a CRP of 9.6, CBC with WBC 5.4, platelets 324 and hemoglobin 12.2, CMP was within normal limitsAnd vitamin D was low (he was sent some oral vitamin D supplementation  Patient reports that he has felt relatively well and denies too many complaints. States that he has felt a week before getting his Avsola dose, he can see some fresh blood when wiping.  The rest of the time he does not see any abnormalities in his stool. He usually has 3 bowel movements per day, with normal consistency. Sometimes he may have some diarrhea bouts, for which he takes Imodium 1-2 times a month -this is much less of the amount of Imodium he was taking in the past. The patient denies having any nausea, vomiting, fever, chills, melena, hematemesis, abdominal distention, abdominal pain, diarrhea, jaundice, pruritus. Has gained some weight recently.  No EIM.  Last Colonoscopy:-  06/29/21 The examined portion of the ileum was normal. - Congested eroded mucosa at the appendiceal orifice. Active colitis - The ascending colon is normal. - The transverse colon, hepatic flexure, ascending colon and cecum are normal. - Crohn's colitis involving splenic flexure proximal descending colon sigmoid colon and rectum. This was severe, worsened compared to  previous examinations.moderately active chronic colitis - One small polyp at the splenic flexure. Inflammatory polyp  Last flu shot:2023 Last pneumonia shot:never Last evaluation by dermatology: Never Last zoster vaccine: Never  Past Medical History: Past Medical History:  Diagnosis Date   Crohn's colitis ([place]) 01/03/2017   Essential hypertension, benign 01/03/2017   Rectal bleeding    chronic    Past Surgical History: Past Surgical History:  Procedure Laterality Date   BIOPSY  06/29/2021   Procedure: BIOPSY;  Surgeon: Rogene Houston, MD;  [facility];  Service: Endoscopy;;   COLONOSCOPY WITH PROPOFOL N/A 06/29/2021   Procedure: COLONOSCOPY WITH PROPOFOL;  Surgeon: Rogene Houston, MD;  [facility];  Service: Endoscopy;  Laterality: N/A;  205 ASA 1   POLYPECTOMY  06/29/2021   Procedure: POLYPECTOMY INTESTINAL;  Surgeon: Rogene Houston, MD;  [facility];  Service: Endoscopy;;    Family History:No family history on file.  Social History: Social History   Tobacco Use  Smoking Status Never   Passive exposure: Never  Smokeless Tobacco Never   Social History   Substance and Sexual Activity  Alcohol Use No   Social History   Substance and Sexual Activity  Drug Use No    Allergies: Allergies  Allergen Reactions   Morphine And Related     Patient states that he felt like his body was on fire , N&V    Medications: Current Outpatient Medications  Medication Sig Dispense Refill   amLODipine-valsartan (EXFORGE) 5-160 MG tablet Take 1 tablet by mouth daily.     Cholecalciferol (VITAMIN D-3) 125 MCG (5000 UT) TABS  Take 5,000 Units by mouth daily. 60 tablet 2   loperamide (IMODIUM) 2 MG capsule Take 1 capsule (2 mg total) by mouth 2 (two) times daily as needed for diarrhea or loose stools. 60 capsule 5   PRESCRIPTION MEDICATION Avsola infusion at [facility] rheumatology     Probiotic Product (PROBIOTIC ADVANCED PO) Take by mouth daily at 6  (six) AM.     dicyclomine (BENTYL) 10 MG capsule TAKE 1 CAPSULE BY MOUTH THREE TIMES DAILY AS NEEDED FOR SPASMS (Patient not taking: Reported on 10/31/2021) 90 capsule 2   hydrochlorothiazide (MICROZIDE) 12.5 MG capsule Take 12.5 mg by mouth daily. (Patient not taking: Reported on 10/31/2021)     hyoscyamine (LEVSIN) 0.125 MG tablet Take 0.125 mg by mouth daily. (Patient not taking: Reported on 10/31/2021)     No current facility-administered medications for this visit.    Review of Systems: GENERAL: negative for malaise, night sweats HEENT: No changes in hearing or vision, no nose bleeds or other nasal problems. NECK: Negative for lumps, goiter, pain and significant neck swelling RESPIRATORY: Negative for cough, wheezing CARDIOVASCULAR: Negative for chest pain, leg swelling, palpitations, orthopnea GI: SEE HPI MUSCULOSKELETAL: Negative for joint pain or swelling, back pain, and muscle pain. SKIN: Negative for lesions, rash PSYCH: Negative for sleep disturbance, mood disorder and recent psychosocial stressors. HEMATOLOGY Negative for prolonged bleeding, bruising easily, and swollen nodes. ENDOCRINE: Negative for cold or heat intolerance, polyuria, polydipsia and goiter. NEURO: negative for tremor, gait imbalance, syncope and seizures. The remainder of the review of systems is noncontributory.   Physical Exam: BP 99/67 (BP Location: Left Arm, Patient Position: Sitting, Cuff Size: Large)   Pulse (!) 104   Temp 97.9 F (36.6 C) (Oral)   Ht 6\' 4"  (1.93 m)   Wt 270 lb 4.8 oz (122.6 kg)   BMI 32.90 kg/m  GENERAL: The patient is AO x3, in no acute distress. HEENT: Head is normocephalic and atraumatic. EOMI are intact. Mouth is well hydrated and without lesions. NECK: Supple. No masses LUNGS: Clear to auscultation. No presence of rhonchi/wheezing/rales. Adequate chest expansion HEART: RRR, normal s1 and s2. ABDOMEN: Soft, nontender, no guarding, no peritoneal signs, and nondistended. BS +. No  masses. EXTREMITIES: Without any cyanosis, clubbing, rash, lesions or edema. NEUROLOGIC: AOx3, no focal motor deficit. SKIN: no jaundice, no rashes  Imaging/Labs: as above  I personally reviewed and interpreted the available labs, imaging and endoscopic files.  Impression and Plan: Dwayne Cooper is a 47 y.o. male with past history of ileocolonic Crohn's disease and hypertension, who presents for follow up of Crohn's disease.  The patient has presented improvement of his symptoms while using Avsola every 8 weeks and 5 mg/kg dosing.  However, he has noticed he may have some mild bleeding just before he is supposed to receive his next dose.  We discussed that it is possible his medication levels are suboptimal and this may need to be uptitrated.  Will check his infliximab levels before his next dose.  For now he will continue on every 8-week dosing and will check repeat CBC, CMP, vitamin D and CRP as well.  Regarding his preventative measures, we will refer him to dermatology for annual skin cancer screening and he was advised to receive pneumonia and shingles vaccine.  -Continue Avsola every 8 weeks -Check CBC, CMP, CRP, infliximab levels/antibodies and vitamin D level before next Avsola infusion -Dermatology referral -Ask PCP about pneumonia and shingles vaccines -Return to clinic in 6 months  All questions were  answered.      Katrinka Blazing, MD Gastroenterology and Hepatology [facility]

## 2022-05-15 NOTE — Patient Instructions (Addendum)
Continue Avsola every 8 weeks Perform blood workup the day before your next Avsola infusion Dermatology referral Ask your PCP about pneumonia and shingles vaccines

## 2022-06-15 ENCOUNTER — Other Ambulatory Visit (INDEPENDENT_AMBULATORY_CARE_PROVIDER_SITE_OTHER): Payer: Self-pay | Admitting: Gastroenterology

## 2022-11-13 ENCOUNTER — Ambulatory Visit (INDEPENDENT_AMBULATORY_CARE_PROVIDER_SITE_OTHER): Payer: No Typology Code available for payment source | Admitting: Gastroenterology

## 2023-04-26 ENCOUNTER — Telehealth (INDEPENDENT_AMBULATORY_CARE_PROVIDER_SITE_OTHER): Payer: Self-pay | Admitting: *Deleted

## 2023-04-26 NOTE — Telephone Encounter (Signed)
Lawson Fiscal from [facility] is requesting most recent office visit note for Prior auth.  Dewayne Hatch, would you print me the office visit note from 05/15/22 and I will fax over to [place]. Thanks.   [facility].  Fax number [phone]

## 2023-04-26 NOTE — Telephone Encounter (Signed)
Thank you :)

## 2023-04-26 NOTE — Telephone Encounter (Signed)
Note has been faxed.

## 2023-05-10 ENCOUNTER — Ambulatory Visit: Payer: No Typology Code available for payment source | Admitting: Dermatology

## 2023-05-18 ENCOUNTER — Telehealth (INDEPENDENT_AMBULATORY_CARE_PROVIDER_SITE_OTHER): Payer: Self-pay

## 2023-05-18 NOTE — Telephone Encounter (Signed)
Lawson Fiscal from [facility] [phone] called to say Avsola, no longer covered by the patient's insurance. Needs a new order for Remicade. I have placed the new orders on the providers desk for signature to be filled out and returned to [employer] at [phone].

## 2023-05-18 NOTE — Telephone Encounter (Signed)
Thanks, will sign

## 2023-05-21 NOTE — Telephone Encounter (Signed)
Mitzie, please call the patient and schedule him with the next opening with any provider. Thanks,  I spoke with the patient and let him know that per Dr. [name], we can not sign orders for his Remicade until he is seen in the clinic first as he has not been seen in a year here. Patient states understanding, and I transferred him to Mitzie to schedule with next open provider.

## 2023-05-22 NOTE — Telephone Encounter (Signed)
I spoke with Dwayne Cooper at [facility] and made her aware, the patient will need ov with Dr. [name] before we can send new orders to her. I made her aware the patient is scheduled here for an ov on 06/25/2023, and we would be sending her the information she requested after this visit. Patient's next infusion is scheduled for 07/12/2023, per Lawson Fiscal.

## 2023-05-24 ENCOUNTER — Ambulatory Visit: Payer: BC Managed Care – PPO | Admitting: Dermatology

## 2023-06-14 ENCOUNTER — Other Ambulatory Visit (INDEPENDENT_AMBULATORY_CARE_PROVIDER_SITE_OTHER): Payer: Self-pay | Admitting: Gastroenterology

## 2023-06-14 NOTE — Telephone Encounter (Signed)
Please ask patient to follow with PCP regarding this medication, we usually start it but refills/monitoring should be continued by PCP

## 2023-06-14 NOTE — Telephone Encounter (Signed)
I spoke with the patient and made him aware if he is needing this medication he should reach out to his PCP, as the should continue to monitor this and prescribe the medication. Patient says he has reached out to the PCP to take care of this already.

## 2023-06-25 ENCOUNTER — Encounter (INDEPENDENT_AMBULATORY_CARE_PROVIDER_SITE_OTHER): Payer: Self-pay | Admitting: Gastroenterology

## 2023-06-25 ENCOUNTER — Ambulatory Visit (INDEPENDENT_AMBULATORY_CARE_PROVIDER_SITE_OTHER): Payer: BC Managed Care – PPO | Admitting: Gastroenterology

## 2023-06-25 VITALS — BP 127/82 | HR 99 | Temp 98.6°F | Ht 76.0 in | Wt 259.5 lb

## 2023-06-25 DIAGNOSIS — K5080 Crohn's disease of both small and large intestine without complications: Secondary | ICD-10-CM | POA: Diagnosis not present

## 2023-06-25 DIAGNOSIS — Z1159 Encounter for screening for other viral diseases: Secondary | ICD-10-CM

## 2023-06-25 DIAGNOSIS — Z111 Encounter for screening for respiratory tuberculosis: Secondary | ICD-10-CM

## 2023-06-25 DIAGNOSIS — Z1321 Encounter for screening for nutritional disorder: Secondary | ICD-10-CM

## 2023-06-25 NOTE — Progress Notes (Unsigned)
 Katrinka Blazing, M.D. Gastroenterology & Hepatology [facility] [address] [place], [place] [address]  Primary Care Physician: Practice, [facility] [address] [place] [address]  I will communicate my assessment and recommendations to the referring MD via EMR.  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 male with past history of ileocolonic Crohn's disease and hypertension, who presents for follow up of Crohn's disease.  The patient was last seen on 05/15/2022. At that time, the patient was continued on Avsola every 8 weeks.  He was advised to have his infliximab level and antibodies checked before his next dose of Avsola, as well as to have CBC, CMP, CRP and vitamin D levels checked but he never performed these tests.  Patient reports that he received Tamiflu and other treatments for management of flu infection. He reported having some episodes of blood specks in his stool. He had this during the January time, however he also has presented this for the last month.  He is having 2 bowel movements per day, morning stool is watery while in the afternoon is more formed. He has bloating frequently in the morning.   The patient denies having any nausea, vomiting, fever, chills, melena, hematemesis, abdominal distention, abdominal pain, diarrhea, jaundice, pruritus or weight loss.  Last dose of infliximab was around mid January. He was on Avsola in the past but insurance changed and it was requested he was switched to Remicade.  Last Colonoscopy:-  06/29/21 The examined portion of the ileum was normal. - Congested eroded mucosa at the appendiceal orifice. Active colitis - The ascending colon is normal. - The transverse colon, hepatic flexure, ascending colon and cecum are normal. - Crohn's colitis involving splenic flexure proximal descending colon  sigmoid colon and rectum. This was severe, worsened compared to previous examinations.moderately active chronic colitis - One small polyp at the splenic flexure. Inflammatory polyp   Last flu shot:2024 Last pneumonia shot: never Last evaluation by dermatology: Never,could not attend his appointment Last zoster vaccine: Never  Past Medical History: Past Medical History:  Diagnosis Date   Crohn's colitis ([facility]) 01/03/2017   Essential hypertension, benign 01/03/2017   Rectal bleeding    chronic    Past Surgical History: Past Surgical History:  Procedure Laterality Date   BIOPSY  06/29/2021   Procedure: BIOPSY;  Surgeon: Malissa Hippo, MD;  [facility];  Service: Endoscopy;;   COLONOSCOPY WITH PROPOFOL N/A 06/29/2021   Procedure: COLONOSCOPY WITH PROPOFOL;  Surgeon: Malissa Hippo, MD;  [facility];  Service: Endoscopy;  Laterality: N/A;  205 ASA 1   POLYPECTOMY  06/29/2021   Procedure: POLYPECTOMY INTESTINAL;  Surgeon: Malissa Hippo, MD;  [facility];  Service: Endoscopy;;    Family History:History reviewed. No pertinent family history.  Social History: Social History   Tobacco Use  Smoking Status Never   Passive exposure: Never  Smokeless Tobacco Never   Social History   Substance and Sexual Activity  Alcohol Use No   Social History   Substance and Sexual Activity  Drug Use No    Allergies: Allergies  Allergen Reactions   Morphine And Codeine     Patient states that he felt like his body was on fire , N&V    Medications: Current Outpatient Medications  Medication Sig Dispense Refill   amLODipine-valsartan (EXFORGE) 5-160 MG tablet Take 1  tablet by mouth daily.     Cholecalciferol (VITAMIN D-3) 125 MCG (5000 UT) TABS TAKE ONE CAPSULE BY MOUTH EVERY DAY 60 tablet 2   hydrochlorothiazide (MICROZIDE) 12.5 MG capsule Take 12.5 mg by mouth daily.     loperamide (IMODIUM) 2 MG capsule Take 1 capsule (2 mg total) by mouth 2 (two)  times daily as needed for diarrhea or loose stools. 60 capsule 5   PRESCRIPTION MEDICATION Avsola infusion at [facility] rheumatology     No current facility-administered medications for this visit.    Review of Systems: GENERAL: negative for malaise, night sweats HEENT: No changes in hearing or vision, no nose bleeds or other nasal problems. NECK: Negative for lumps, goiter, pain and significant neck swelling RESPIRATORY: Negative for cough, wheezing CARDIOVASCULAR: Negative for chest pain, leg swelling, palpitations, orthopnea GI: SEE HPI MUSCULOSKELETAL: Negative for joint pain or swelling, back pain, and muscle pain. SKIN: Negative for lesions, rash PSYCH: Negative for sleep disturbance, mood disorder and recent psychosocial stressors. HEMATOLOGY Negative for prolonged bleeding, bruising easily, and swollen nodes. ENDOCRINE: Negative for cold or heat intolerance, polyuria, polydipsia and goiter. NEURO: negative for tremor, gait imbalance, syncope and seizures. The remainder of the review of systems is noncontributory.   Physical Exam: BP 127/82 (BP Location: Left Arm, Patient Position: Sitting, Cuff Size: Large)   Pulse 99   Temp 98.6 F (37 C) (Temporal)   Ht 6\' 4"  (1.93 m)   Wt 259 lb 8 oz (117.7 kg)   BMI 31.59 kg/m  GENERAL: The patient is AO x3, in no acute distress. HEENT: Head is normocephalic and atraumatic. EOMI are intact. Mouth is well hydrated and without lesions. NECK: Supple. No masses LUNGS: Clear to auscultation. No presence of rhonchi/wheezing/rales. Adequate chest expansion HEART: RRR, normal s1 and s2. ABDOMEN: Soft, nontender, no guarding, no peritoneal signs, and nondistended. BS +. No masses. RECTAL EXAM: no external lesions, normal tone, no masses, brown stool without blood.*** Chaperone: EXTREMITIES: Without any cyanosis, clubbing, rash, lesions or edema. NEUROLOGIC: AOx3, no focal motor deficit. SKIN: no jaundice, no rashes  Imaging/Labs: as  above  I personally reviewed and interpreted the available labs, imaging and endoscopic files.  Impression and Plan: Dwayne Cooper is a 48 y.o. male coming for follow up of ***   All questions were answered.      Katrinka Blazing, MD Gastroenterology and Hepatology [facility]

## 2023-06-25 NOTE — Patient Instructions (Signed)
 Continue Remicade every 8 weeks Perform blood workup the day before your next Remicade dose Perform stool test As PCP about pneumonia and shingles vaccination Dermatology referral

## 2023-06-26 NOTE — Telephone Encounter (Signed)
 Patient was seen by Dr. [name] on 06/05/2023 ov and records sent to [facility] with new orders. Please see the fax confirmation to her below.

## 2023-06-26 NOTE — Telephone Encounter (Signed)
 New orders faxed to [facility].  This message was sent via FAXCOM, a product from [employer]. http://www.biscom.com/                    -------Fax Transmission Report-------  To:               Recipient at [phone] Subject:          [name] Result:           The transmission was successful. Explanation:      All Pages Ok Pages Sent:       17 Connect Time:     10 minutes, 23 seconds Transmit Time:    06/26/2023 08:05 Transfer Rate:    14400 Status Code:      0000 Retry Count:      0 Job Id:           [id number] Unique Id:        [id number] Fax Line:         29 Fax Server:       [id number]

## 2023-07-16 LAB — COMPREHENSIVE METABOLIC PANEL
AG Ratio: 1.5 (calc) (ref 1.0–2.5)
ALT: 17 U/L (ref 9–46)
AST: 17 U/L (ref 10–40)
Albumin: 4 g/dL (ref 3.6–5.1)
Alkaline phosphatase (APISO): 76 U/L (ref 36–130)
BUN: 23 mg/dL (ref 7–25)
CO2: 26 mmol/L (ref 20–32)
Calcium: 8.9 mg/dL (ref 8.6–10.3)
Chloride: 108 mmol/L (ref 98–110)
Creat: 1.05 mg/dL (ref 0.60–1.29)
Globulin: 2.6 g/dL (ref 1.9–3.7)
Glucose, Bld: 117 mg/dL (ref 65–139)
Potassium: 3.9 mmol/L (ref 3.5–5.3)
Sodium: 142 mmol/L (ref 135–146)
Total Bilirubin: 0.7 mg/dL (ref 0.2–1.2)
Total Protein: 6.6 g/dL (ref 6.1–8.1)

## 2023-07-16 LAB — CBC WITH DIFFERENTIAL/PLATELET
Absolute Lymphocytes: 1498 {cells}/uL (ref 850–3900)
Absolute Monocytes: 333 {cells}/uL (ref 200–950)
Basophils Absolute: 32 {cells}/uL (ref 0–200)
Basophils Relative: 0.5 %
Eosinophils Absolute: 230 {cells}/uL (ref 15–500)
Eosinophils Relative: 3.6 %
HCT: 41.8 % (ref 38.5–50.0)
Hemoglobin: 14 g/dL (ref 13.2–17.1)
MCH: 28.5 pg (ref 27.0–33.0)
MCHC: 33.5 g/dL (ref 32.0–36.0)
MCV: 85 fL (ref 80.0–100.0)
MPV: 10.6 fL (ref 7.5–12.5)
Monocytes Relative: 5.2 %
Neutro Abs: 4307 {cells}/uL (ref 1500–7800)
Neutrophils Relative %: 67.3 %
Platelets: 232 10*3/uL (ref 140–400)
RBC: 4.92 10*6/uL (ref 4.20–5.80)
RDW: 13.6 % (ref 11.0–15.0)
Total Lymphocyte: 23.4 %
WBC: 6.4 10*3/uL (ref 3.8–10.8)

## 2023-07-16 LAB — QUANTIFERON-TB GOLD PLUS
Mitogen-NIL: 9.15 [IU]/mL
NIL: 0.02 [IU]/mL
QuantiFERON-TB Gold Plus: NEGATIVE
TB1-NIL: 0 [IU]/mL
TB2-NIL: 0 [IU]/mL

## 2023-07-16 LAB — HEPATITIS B SURFACE ANTIGEN: Hepatitis B Surface Ag: NONREACTIVE

## 2023-07-16 LAB — IRON,TIBC AND FERRITIN PANEL
%SAT: 18 % — ABNORMAL LOW (ref 20–48)
Ferritin: 13 ng/mL — ABNORMAL LOW (ref 38–380)
Iron: 66 ug/dL (ref 50–180)
TIBC: 375 ug/dL (ref 250–425)

## 2023-07-16 LAB — C-REACTIVE PROTEIN: CRP: 3.8 mg/L (ref <8.0)

## 2023-09-02 ENCOUNTER — Telehealth (INDEPENDENT_AMBULATORY_CARE_PROVIDER_SITE_OTHER): Payer: Self-pay | Admitting: Gastroenterology

## 2023-09-02 NOTE — Telephone Encounter (Signed)
 Received results of most recent blood workup from 08/24/2023 which showed CBC with WBC 5.5, hemoglobin 15.3 and platelets 281, CMP with normal renal function creatinine 1.09, BUN 17, normal electrolytes, AST 19, ALT 19, alkaline phosphatase 93, total bilirubin 0.5, total cholesterol 106, triglycerides 116, vitamin D  46 normal.  Patient is supposed to have repeat labs the day before his next infusion to determine levels of medication.

## 2023-12-07 ENCOUNTER — Encounter (INDEPENDENT_AMBULATORY_CARE_PROVIDER_SITE_OTHER): Payer: Self-pay | Admitting: Gastroenterology

## 2024-03-05 ENCOUNTER — Encounter (INDEPENDENT_AMBULATORY_CARE_PROVIDER_SITE_OTHER): Payer: Self-pay | Admitting: Gastroenterology

## 2024-05-27 ENCOUNTER — Telehealth (INDEPENDENT_AMBULATORY_CARE_PROVIDER_SITE_OTHER): Payer: Self-pay | Admitting: Gastroenterology

## 2024-05-27 ENCOUNTER — Encounter (INDEPENDENT_AMBULATORY_CARE_PROVIDER_SITE_OTHER): Payer: Self-pay | Admitting: Gastroenterology

## 2024-05-27 ENCOUNTER — Ambulatory Visit (INDEPENDENT_AMBULATORY_CARE_PROVIDER_SITE_OTHER): Admitting: Gastroenterology

## 2024-05-27 VITALS — BP 122/87 | HR 98 | Temp 97.8°F | Ht 76.0 in | Wt 256.5 lb

## 2024-05-27 DIAGNOSIS — Z1321 Encounter for screening for nutritional disorder: Secondary | ICD-10-CM

## 2024-05-27 DIAGNOSIS — Z1159 Encounter for screening for other viral diseases: Secondary | ICD-10-CM

## 2024-05-27 DIAGNOSIS — Z5181 Encounter for therapeutic drug level monitoring: Secondary | ICD-10-CM | POA: Diagnosis not present

## 2024-05-27 DIAGNOSIS — R197 Diarrhea, unspecified: Secondary | ICD-10-CM

## 2024-05-27 DIAGNOSIS — Z79620 Long term (current) use of immunosuppressive biologic: Secondary | ICD-10-CM

## 2024-05-27 DIAGNOSIS — Z111 Encounter for screening for respiratory tuberculosis: Secondary | ICD-10-CM

## 2024-05-27 DIAGNOSIS — K5080 Crohn's disease of both small and large intestine without complications: Secondary | ICD-10-CM

## 2024-05-27 DIAGNOSIS — K50818 Crohn's disease of both small and large intestine with other complication: Secondary | ICD-10-CM | POA: Diagnosis not present

## 2024-05-27 NOTE — Patient Instructions (Addendum)
-  infliximab  levels to be drawn just before next infusion -you can do stool test and other labs today -continue Avsola  every 8 weeks for now  Pending what labs show, I may send a course of steroids. We will discuss labs once these result and next steps in your treatment  Follow up 6 weeks

## 2024-05-27 NOTE — Telephone Encounter (Signed)
 Lab work from PCP office  Hemoglobin 13.2 platelet 283 normal liver enzymes

## 2024-05-27 NOTE — Progress Notes (Unsigned)
 "  Referring Provider: Practice, [facility] Physician:  Lari Elspeth BRAVO, MD Primary GI Physician: Previously Dr. Eartha (Dr. Cinderella)  Chief Complaint  Patient presents with   Follow-up    Patient here today for a follow up states his is having trouble with controlling his bowels. He has to run to the restroom. Stools are loose and has some bright red blood per rectum. Has a history of Crohn's, and has Avsola  infusions every eight weeks.    HPI:   Dwayne Cooper is a 49 y.o. male with past medical history of ileocolonic Crohn's disease and hypertension   Patient presenting today for:  Follow up of Crohn's disease on Avsola  every 8 weeks (diagnosed 2018)  Last seen feb 2025 by Dr. Eartha, at that time had recent flu infection, some specks of blood in stools. 2 Bms per day, watery in the AM and more formed in PM, bloating in the morning   Recommended to continue remicade every 8 weeks, CBC, CMP, remicade levels/ab, CRP, iron  stores, vitamin D . Hep B and TB, fecal calprotectin, pna and shingles vaccines via PCP, dermatology referral  Infliximab  levels/ab were not completed Calprotectin not completed Vitamin D  not completed  Labs march 2025  CRP 3.8 Iron  66, TIBC 375 Sat 18 Ferritin 13 TB quant negative Hep Bs  Ag negative  CBC in January 2026 WNL with hgb 13.2 CMP WNL other than chloride 107 calcium 8.5   Present:  States he is having a lot of issues with diarrhea and urgency. Having a lot of urgency after eating. Reports symptoms have been worse over the last 3-4 months but began maybe before the end of summer. He states prior to this he had diarrhea unless he took imodium . He is taking imodium  now which slows things down short term but does not really control his diarrhea. Denies any abdominal pain. Has been seeing some blood stools as well for the past few months. Denies any recent antibiotics or new meds prior to onset of symptoms.   EIM: Joints: no  swelling or pain No skin lesions or rashes Vision: worsening vision in left eye from previous injury   Flu shot: no  Pna shot: 2025  Shingles vaccine: unsure  Covid vaccine: none Dermatology: mid 2025 Eye doctor: saw 2-3 weeks ago    Previous therapies: Humira  Oral mesalamine   Last Colonoscopy:-  06/29/21 The examined portion of the ileum was normal. - Congested eroded mucosa at the appendiceal orifice. Active colitis - The ascending colon is normal. - The transverse colon, hepatic flexure, ascending colon and cecum are normal. - Crohn's colitis involving splenic flexure proximal descending colon sigmoid colon and rectum. This was severe, worsened compared to previous examinations.moderately active chronic colitis - One small polyp at the splenic flexure. Inflammatory polyp  Filed Weights   05/27/24 1418  Weight: 256 lb 8 oz (116.3 kg)     Past Medical History:  Diagnosis Date   Crohn's colitis (HCC) 01/03/2017   Essential hypertension, benign 01/03/2017   Rectal bleeding    chronic    Past Surgical History:  Procedure Laterality Date   BIOPSY  06/29/2021   Procedure: BIOPSY;  Surgeon: Golda Claudis PENNER, MD;  Location: [facility];  Service: Endoscopy;;   COLONOSCOPY WITH PROPOFOL  N/A 06/29/2021   Procedure: COLONOSCOPY WITH PROPOFOL ;  Surgeon: Golda Claudis PENNER, MD;  Location: [facility];  Service: Endoscopy;  Laterality: N/A;  205 ASA 1   POLYPECTOMY  06/29/2021   Procedure: POLYPECTOMY  INTESTINAL;  Surgeon: Golda Claudis PENNER, MD;  Location: [facility];  Service: Endoscopy;;    Current Outpatient Medications  Medication Sig Dispense Refill   amLODipine -valsartan (EXFORGE) 5-160 MG tablet Take 1 tablet by mouth daily.     Cholecalciferol (VITAMIN D -3) 125 MCG (5000 UT) TABS TAKE ONE CAPSULE BY MOUTH EVERY DAY 60 tablet 2   ibuprofen (ADVIL) 200 MG tablet Take 200 mg by mouth every 6 (six) hours as needed.     loperamide  (IMODIUM ) 2 MG capsule Take 1 capsule (2 mg total)  by mouth 2 (two) times daily as needed for diarrhea or loose stools. 60 capsule 5   PRESCRIPTION MEDICATION Avsola  infusion at [facility] rheumatology (Patient taking differently: Avsola  infusion at [facility] rheumatology every 8 weeks.)     No current facility-administered medications for this visit.    Allergies as of 05/27/2024 - Review Complete 05/27/2024  Allergen Reaction Noted   Morphine  and codeine  02/10/2020    Social History   Socioeconomic History   Marital status: Married    Spouse name: Not on file   Number of children: Not on file   Years of education: Not on file   Highest education level: Not on file  Occupational History   Not on file  Tobacco Use   Smoking status: Never    Passive exposure: Never   Smokeless tobacco: Never  Vaping Use   Vaping status: Never Used  Substance and Sexual Activity   Alcohol use: No   Drug use: No   Sexual activity: Yes  Other Topics Concern   Not on file  Social History Narrative   Not on file   Social Drivers of Health   Tobacco Use: Low Risk (05/27/2024)   Patient History    Smoking Tobacco Use: Never    Smokeless Tobacco Use: Never    Passive Exposure: Never  Financial Resource Strain: Not on file  Food Insecurity: Not on file  Transportation Needs: Not on file  Physical Activity: Not on file  Stress: Not on file  Social Connections: Not on file  Depression (EYV7-0): Not on file  Alcohol Screen: Not on file  Housing: Not on file  Utilities: Not on file  Health Literacy: Not on file    Review of systems General: negative for malaise, night sweats, fever, chills, weight los Neck: Negative for lumps, goiter, pain and significant neck swelling Resp: Negative for cough, wheezing, dyspnea at rest CV: Negative for chest pain, leg swelling, palpitations, orthopnea GI: denies melena, hematochezia, nausea, vomiting, diarrhea, constipation, dysphagia, odyonophagia, early satiety or unintentional weight loss.  MSK:  Negative for joint pain or swelling, back pain, and muscle pain. Derm: Negative for itching or rash Psych: Denies depression, anxiety, memory loss, confusion. No homicidal or suicidal ideation.  Heme: Negative for prolonged bleeding, bruising easily, and swollen nodes. Endocrine: Negative for cold or heat intolerance, polyuria, polydipsia and goiter. Neuro: negative for tremor, gait imbalance, syncope and seizures. The remainder of the review of systems is noncontributory.  Physical Exam: BP (!) 144/85 (BP Location: Left Arm, Patient Position: Sitting, Cuff Size: Large)   Pulse (!) 105   Temp 97.8 F (36.6 C) (Temporal)   Ht 6' 4 (1.93 m)   Wt 256 lb 8 oz (116.3 kg)   BMI 31.22 kg/m  General:   Alert and oriented. No distress noted. Pleasant and cooperative.  Head:  Normocephalic and atraumatic. Eyes:  Conjuctiva clear without scleral icterus. Mouth:  Oral mucosa pink and moist. Good  dentition. No lesions. Heart: Normal rate and rhythm, s1 and s2 heart sounds present.  Lungs: Clear lung sounds in all lobes. Respirations equal and unlabored. Abdomen:  +BS, soft, non-tender and non-distended. No rebound or guarding. No HSM or masses noted. Derm: No palmar erythema or jaundice Msk:  Symmetrical without gross deformities. Normal posture. Extremities:  Without edema. Neurologic:  Alert and  oriented x4 Psych:  Alert and cooperative. Normal mood and affect.  Invalid input(s): 6 MONTHS   ASSESSMENT: Dwayne Cooper is a 49 y.o. male presenting today    PLAN:  -infliximab  levels day before next infusion -Calprotectin, CRP, vitamin D , iron  panel, B12, folate prior to next dose of Avsola   -Vitamin D , Iron  panel -B12/folate -continue Avsola  Q8w for now  All questions were answered, patient verbalized understanding and is in agreement with plan as outlined above.   Follow Up: 6 weeks   Netanel Yannuzzi L. Sedalia Greeson, MSN, APRN, AGNP-C Adult-Gerontology Nurse Practitioner [facility]  "

## 2024-05-28 ENCOUNTER — Ambulatory Visit (INDEPENDENT_AMBULATORY_CARE_PROVIDER_SITE_OTHER): Admitting: Gastroenterology

## 2024-05-28 ENCOUNTER — Encounter (INDEPENDENT_AMBULATORY_CARE_PROVIDER_SITE_OTHER): Payer: Self-pay | Admitting: Gastroenterology

## 2024-05-29 ENCOUNTER — Ambulatory Visit (INDEPENDENT_AMBULATORY_CARE_PROVIDER_SITE_OTHER): Payer: Self-pay | Admitting: Gastroenterology

## 2024-05-29 ENCOUNTER — Other Ambulatory Visit (INDEPENDENT_AMBULATORY_CARE_PROVIDER_SITE_OTHER): Payer: Self-pay | Admitting: Gastroenterology

## 2024-05-29 LAB — VITAMIN D 25 HYDROXY (VIT D DEFICIENCY, FRACTURES): Vit D, 25-Hydroxy: 31 ng/mL (ref 30–100)

## 2024-05-29 LAB — QUANTIFERON-TB GOLD PLUS
Mitogen-NIL: >10 [IU]/mL
NIL: 0.02 [IU]/mL
QuantiFERON-TB Gold Plus: NEGATIVE
TB1-NIL: 0 [IU]/mL
TB2-NIL: 0 [IU]/mL

## 2024-05-29 LAB — IRON,TIBC AND FERRITIN PANEL
%SAT: 14 % — ABNORMAL LOW (ref 20–48)
Ferritin: 40 ng/mL (ref 38–380)
Iron: 49 ug/dL — ABNORMAL LOW (ref 50–180)
TIBC: 357 ug/dL (ref 250–425)

## 2024-05-29 LAB — HEPATITIS B SURFACE ANTIGEN: Hepatitis B Surface Ag: NONREACTIVE

## 2024-05-29 LAB — B12 AND FOLATE PANEL
Folate: 12.2 ng/mL
Vitamin B-12: 175 pg/mL — ABNORMAL LOW (ref 200–1100)

## 2024-05-29 LAB — C-REACTIVE PROTEIN: CRP: 15.9 mg/L — ABNORMAL HIGH

## 2024-05-29 MED ORDER — VITAMIN D-3 125 MCG (5000 UT) PO TABS: 5000.0000 [IU] | ORAL_TABLET | Freq: Every day | ORAL | 1 refills | Status: AC

## 2024-05-29 MED ORDER — METHYLCOBALAMIN 1000 MCG SL SUBL: 2.0000 | SUBLINGUAL_TABLET | Freq: Every day | SUBLINGUAL | 1 refills | Status: AC

## 2024-05-29 MED ORDER — IRON GLYCINATE 29 MG PO CAPS: 1.0000 | ORAL_CAPSULE | Freq: Every day | ORAL | 1 refills | Status: AC

## 2024-06-02 ENCOUNTER — Other Ambulatory Visit (INDEPENDENT_AMBULATORY_CARE_PROVIDER_SITE_OTHER): Payer: Self-pay | Admitting: Gastroenterology

## 2024-06-02 MED ORDER — BUDESONIDE 3 MG PO CPEP: ORAL_CAPSULE | ORAL | 0 refills | Status: AC

## 2024-06-03 LAB — CALPROTECTIN: Calprotectin: >8000 ug/g — ABNORMAL HIGH

## 2024-06-03 NOTE — Telephone Encounter (Signed)
 I spoke with the patient he states [facility] had all of his medications ready for pick up. I asked if when he got there and anything was missing to let us  know. Patient states understanding.

## 2024-07-10 ENCOUNTER — Ambulatory Visit (INDEPENDENT_AMBULATORY_CARE_PROVIDER_SITE_OTHER): Admitting: Gastroenterology
